# Patient Record
Sex: Male | Born: 1960 | Race: Black or African American | Hispanic: No | Smoking: Current every day smoker
Health system: Southern US, Community
[De-identification: ages and names within clinical notes are randomized; demographics above are authoritative.]

## PROBLEM LIST (undated history)

## (undated) DIAGNOSIS — I10 Essential (primary) hypertension: Secondary | ICD-10-CM

## (undated) DIAGNOSIS — F32A Depression, unspecified: Secondary | ICD-10-CM

## (undated) DIAGNOSIS — J45909 Unspecified asthma, uncomplicated: Secondary | ICD-10-CM

## (undated) DIAGNOSIS — I219 Acute myocardial infarction, unspecified: Secondary | ICD-10-CM

## (undated) DIAGNOSIS — R569 Unspecified convulsions: Secondary | ICD-10-CM

## (undated) HISTORY — DX: Depression, unspecified: F32.A

## (undated) HISTORY — DX: Unspecified convulsions: R56.9

## (undated) HISTORY — DX: Unspecified asthma, uncomplicated: J45.909

## (undated) HISTORY — DX: Essential (primary) hypertension: I10

## (undated) HISTORY — DX: Acute myocardial infarction, unspecified: I21.9

---

## 2021-03-27 ENCOUNTER — Ambulatory Visit: Payer: Self-pay | Admitting: Nurse Practitioner

## 2021-03-29 ENCOUNTER — Other Ambulatory Visit: Payer: Self-pay

## 2021-03-29 ENCOUNTER — Encounter: Payer: Self-pay | Admitting: Nurse Practitioner

## 2021-03-29 ENCOUNTER — Ambulatory Visit (INDEPENDENT_AMBULATORY_CARE_PROVIDER_SITE_OTHER): Payer: Medicare PPO | Admitting: Nurse Practitioner

## 2021-03-29 VITALS — BP 155/99 | HR 82 | Temp 97.8°F | Ht 70.5 in | Wt 199.0 lb

## 2021-03-29 DIAGNOSIS — Z131 Encounter for screening for diabetes mellitus: Secondary | ICD-10-CM | POA: Diagnosis not present

## 2021-03-29 DIAGNOSIS — Z0000 Encounter for general adult medical examination without abnormal findings: Secondary | ICD-10-CM

## 2021-03-29 DIAGNOSIS — F3289 Other specified depressive episodes: Secondary | ICD-10-CM

## 2021-03-29 DIAGNOSIS — Z5900 Homelessness unspecified: Secondary | ICD-10-CM

## 2021-03-29 DIAGNOSIS — I10 Essential (primary) hypertension: Secondary | ICD-10-CM | POA: Diagnosis not present

## 2021-03-29 DIAGNOSIS — Z23 Encounter for immunization: Secondary | ICD-10-CM | POA: Diagnosis not present

## 2021-03-29 DIAGNOSIS — Z760 Encounter for issue of repeat prescription: Secondary | ICD-10-CM | POA: Diagnosis not present

## 2021-03-29 DIAGNOSIS — R413 Other amnesia: Secondary | ICD-10-CM

## 2021-03-29 LAB — POCT URINALYSIS DIP (CLINITEK)
Bilirubin, UA: NEGATIVE
Blood, UA: NEGATIVE
Glucose, UA: NEGATIVE mg/dL
Ketones, POC UA: NEGATIVE mg/dL
Leukocytes, UA: NEGATIVE
Nitrite, UA: NEGATIVE
POC PROTEIN,UA: NEGATIVE
Spec Grav, UA: 1.02 (ref 1.010–1.025)
Urobilinogen, UA: 0.2 E.U./dL
pH, UA: 6 (ref 5.0–8.0)

## 2021-03-29 LAB — POCT GLYCOSYLATED HEMOGLOBIN (HGB A1C): Hemoglobin A1C: 5.7 % — AB (ref 4.0–5.6)

## 2021-03-29 LAB — GLUCOSE, POCT (MANUAL RESULT ENTRY): POC Glucose: 120 mg/dl — AB (ref 70–99)

## 2021-03-29 MED ORDER — CITALOPRAM HYDROBROMIDE 20 MG PO TABS: 20.0000 mg | ORAL_TABLET | Freq: Every day | ORAL | 2 refills | Status: DC

## 2021-03-29 MED ORDER — CARBAMAZEPINE 200 MG PO TABS: 200.0000 mg | ORAL_TABLET | Freq: Two times a day (BID) | ORAL | 2 refills | Status: DC

## 2021-03-29 MED ORDER — LISINOPRIL 20 MG PO TABS: 20.0000 mg | ORAL_TABLET | Freq: Every day | ORAL | 2 refills | Status: DC

## 2021-03-29 MED ORDER — AMLODIPINE BESYLATE 10 MG PO TABS
10.0000 mg | ORAL_TABLET | Freq: Every day | ORAL | 2 refills | Status: DC
Start: 2021-03-29 — End: 2021-07-28

## 2021-03-29 MED ORDER — METOPROLOL TARTRATE 100 MG PO TABS: 100.0000 mg | ORAL_TABLET | Freq: Two times a day (BID) | ORAL | 2 refills | Status: DC

## 2021-03-29 NOTE — Progress Notes (Signed)
[place], [place]  [address] Phone:  [phone]   Fax:  [phone] Subjective:   Patient ID: Cole Cannon, male    DOB: 1961/03/16, 60 y.o.   MRN: [id number]  Chief Complaint  Patient presents with   Establish Care    Recently moved to [place], patient stated he is needing to find somewhere to live currently living with step daughter. Wanting a personal care assistant to help with meds at home Needing lab work and medication refills.    Blood In Stools    1 month ago, has a colonoscopy in August. No finding. Still seeing blood when wiping.    HPI Cole Cannon 60 y.o. male with history  has a past medical history of Asthma, Depression, Heart attack ([place]), Hypertension, and Seizures ([place]). Also has verbalized history of a stroke and MI x 2, dates unknown.To the [facility] to establish care. Patient states that he moved from [place], [place] 2 wks ago and is currently residing with stepdaughter. States that he moved after separating from wife. Prior to separation worked as Astronomer.  Requesting personal care assistant for help with medications and assistance with housing. Frequently forgets when/ if he has taken medications. States that he was given some forms to complete, but needs assistance completing them due to history dyslexia and problems with short term memory.  Endorses have intermittent rectal bleeding for several months. Completed colonoscopy 2 mths ago with negative results. Informed by GI that rectal bleeding resulted from strained during bowel movements. Verbalizes noting small amount of bright red blood on toilet tissue after bowel movement. Last BM 2 days ago. He typically has BM daily, but has had a decreased appetite recently.   Endorses drinking 10 alcoholic beverages per week and smoking marijuana. Has used cocaine in the past, last consumption 2 wks ago. States, " After leaving my wife, I felt suicidal, so I did a bunch of  powder." Currently denies any SI/HI.Compliant with all medications, requesting refill, limited pills remaining from previous prescription.   Denies any other complaints during visiting. Denies any fever. Denies any fatigue, chest pain, shortness of breath, HA or dizziness. Denies any blurred vision, numbness or tingling.   Past Medical History:  Diagnosis Date   Asthma    Depression    Heart attack ([place])    Hypertension    Seizures ([place])       Family History  Problem Relation Age of Onset   Cancer Sister    Cancer Brother     Social History   Socioeconomic History   Marital status: Legally Separated    Spouse name: Not on file   Number of children: Not on file   Years of education: Not on file   Highest education level: Not on file  Occupational History   Not on file  Tobacco Use   Smoking status: Every Day    Packs/day: 1.00    Types: Cigarettes   Smokeless tobacco: Never  Substance and Sexual Activity   Alcohol use: Yes    Alcohol/week: 3.0 standard drinks    Types: 3 Shots of liquor per week   Drug use: Yes    Types: Marijuana   Sexual activity: Not Currently  Other Topics Concern   Not on file  Social History Narrative   Not on file   Social Determinants of Health   Financial Resource Strain: Not on file  Food Insecurity: Not on file  Transportation Needs: Not on  file  Physical Activity: Not on file  Stress: Not on file  Social Connections: Not on file  Intimate Partner Violence: Not on file    Outpatient Medications Prior to Visit  Medication Sig Dispense Refill   carbamazepine (TEGRETOL) 200 MG tablet Take 200 mg by mouth 2 (two) times daily. Take 1 tablet by mouth twice daily.     citalopram (CELEXA) 20 MG tablet Take 20 mg by mouth daily. Take 1 tablet by mouth every day for 14 days.     lisinopril (ZESTRIL) 20 MG tablet Take 20 mg by mouth daily.     metoprolol tartrate (LOPRESSOR) 100 MG tablet Take 100 mg by mouth 2 (two) times daily. Take 1  tablet by mouth twice daily with food.     No facility-administered medications prior to visit.    Allergies  Allergen Reactions   Penicillins Anaphylaxis    Review of Systems  Constitutional:  Negative for chills, fever and malaise/fatigue.  HENT: Negative.    Eyes: Negative.   Respiratory:  Negative for cough and shortness of breath.   Cardiovascular:  Negative for chest pain, palpitations and leg swelling.  Gastrointestinal:  Positive for blood in stool. Negative for abdominal pain, constipation, diarrhea, nausea and vomiting.       Rectal bleeding  Genitourinary: Negative.   Musculoskeletal: Negative.   Skin: Negative.   Neurological: Negative.   Endo/Heme/Allergies: Negative.   Psychiatric/Behavioral:  Positive for depression, memory loss and substance abuse. The patient is not nervous/anxious.   All other systems reviewed and are negative.     Objective:    Physical Exam Vitals reviewed.  Constitutional:      General: He is not in acute distress.    Appearance: Normal appearance.  HENT:     Head: Normocephalic.     Right Ear: Tympanic membrane, ear canal and external ear normal.     Left Ear: Tympanic membrane, ear canal and external ear normal.     Nose: Nose normal.     Mouth/Throat:     Mouth: Mucous membranes are dry.     Pharynx: Oropharynx is clear.  Eyes:     Extraocular Movements: Extraocular movements intact.     Conjunctiva/sclera: Conjunctivae normal.     Pupils: Pupils are equal, round, and reactive to light.  Cardiovascular:     Rate and Rhythm: Normal rate and regular rhythm.     Pulses: Normal pulses.     Heart sounds: Normal heart sounds.     Comments: No obvious peripheral edema Pulmonary:     Effort: Pulmonary effort is normal.     Breath sounds: Normal breath sounds.  Abdominal:     General: Abdomen is flat. Bowel sounds are normal. There is no distension.     Palpations: Abdomen is soft. There is no mass.     Tenderness: There is no  abdominal tenderness. There is no right CVA tenderness, left CVA tenderness or guarding.  Musculoskeletal:        General: Normal range of motion.     Cervical back: Normal range of motion and neck supple.  Skin:    General: Skin is warm and dry.     Capillary Refill: Capillary refill takes less than 2 seconds.  Neurological:     General: No focal deficit present.     Mental Status: He is alert and oriented to person, place, and time.  Psychiatric:        Mood and Affect: Mood normal.  Behavior: Behavior normal.        Thought Content: Thought content normal.        Judgment: Judgment normal.    BP (!) 155/99 (BP Location: Right Arm, Patient Position: Sitting)   Pulse 82   Temp 97.8 F (36.6 C)   Ht 5' 10.5" (1.791 m)   Wt 199 lb (90.3 kg)   SpO2 98%   BMI 28.15 kg/m  Wt Readings from Last 3 Encounters:  03/29/21 199 lb (90.3 kg)    Immunization History  Administered Date(s) Administered   Unspecified SARS-COV-2 Vaccination 07/15/2019, 08/19/2019    Diabetic Foot Exam - Simple   No data filed     No results found for: TSH No results found for: WBC, HGB, HCT, MCV, PLT No results found for: NA, K, CHLORIDE, CO2, GLUCOSE, BUN, CREATININE, BILITOT, ALKPHOS, AST, ALT, PROT, ALBUMIN, CALCIUM, ANIONGAP, EGFR, GFR No results found for: CHOL No results found for: HDL No results found for: LDLCALC No results found for: TRIG No results found for: CHOLHDL Lab Results  Component Value Date   HGBA1C 5.7 (A) 03/29/2021       Assessment & Plan:    Problem List Items Addressed This Visit   None Visit Diagnoses     Screening for diabetes mellitus    -  Primary   Relevant Orders   Glucose (CBG) (Completed)   HgB A1c (Completed)   Healthcare maintenance       Relevant Orders   POCT URINALYSIS DIP (CLINITEK) (Completed)   HIV antibody (with reflex)   Flu Vaccine QUAD 71moIM (Fluarix, Fluzone & Alfiuria Quad PF)   Primary hypertension       Relevant Medications    amLODipine (NORVASC) 10 MG tablet, added to regimen   lisinopril (ZESTRIL) 20 MG tablet   metoprolol tartrate (LOPRESSOR) 100 MG tablet   Other Relevant Orders   CBC with Differential/Platelet   Comprehensive metabolic panel   Lipid panel   Medication refill       Relevant Medications   carbamazepine (TEGRETOL) 200 MG tablet   citalopram (CELEXA) 20 MG tablet   Homelessness       Relevant Orders   AMB Referral to [facility]  Other depression       Relevant Medications   citalopram (CELEXA) 20 MG tablet   Other Relevant Orders   Ambulatory referral to Psychology   Poor short term memory       Patient to follow up in 3 mths for reevaluation of social determinants, depression and hypertension, sooner as needed    I have changed Cole Cannon's carbamazepine, citalopram, lisinopril, and metoprolol tartrate. I am also having him start on amLODipine.  Meds ordered this encounter  Medications   amLODipine (NORVASC) 10 MG tablet    Sig: Take 1 tablet (10 mg total) by mouth daily.    Dispense:  30 tablet    Refill:  2   carbamazepine (TEGRETOL) 200 MG tablet    Sig: Take 1 tablet (200 mg total) by mouth 2 (two) times daily. Take 1 tablet by mouth twice daily.    Dispense:  60 tablet    Refill:  2   citalopram (CELEXA) 20 MG tablet    Sig: Take 1 tablet (20 mg total) by mouth daily. Take 1 tablet by mouth every day for 14 days.    Dispense:  30 tablet    Refill:  2   lisinopril (ZESTRIL) 20 MG tablet    Sig: Take  1 tablet (20 mg total) by mouth daily.    Dispense:  30 tablet    Refill:  2   metoprolol tartrate (LOPRESSOR) 100 MG tablet    Sig: Take 1 tablet (100 mg total) by mouth 2 (two) times daily. Take 1 tablet by mouth twice daily with food.    Dispense:  60 tablet    Refill:  2     Teena Dunk, NP

## 2021-03-29 NOTE — Patient Instructions (Signed)
You were seen today in the [facility] to establish care. Labs were collected, results will be available via MyChart or, if abnormal, you will be contacted by clinic staff. You were prescribed medications, please take as directed. Please follow up in 3 mths  for reevaluation of symptoms discussed during visit and B/P.

## 2021-03-30 ENCOUNTER — Telehealth: Payer: Self-pay | Admitting: Clinical

## 2021-03-30 LAB — COMPREHENSIVE METABOLIC PANEL
ALT: 11 IU/L (ref 0–44)
AST: 13 IU/L (ref 0–40)
Albumin/Globulin Ratio: 2.2 (ref 1.2–2.2)
Albumin: 4.6 g/dL (ref 3.8–4.9)
Alkaline Phosphatase: 99 IU/L (ref 44–121)
BUN/Creatinine Ratio: 13 (ref 10–24)
BUN: 15 mg/dL (ref 8–27)
Bilirubin Total: 0.4 mg/dL (ref 0.0–1.2)
CO2: 23 mmol/L (ref 20–29)
Calcium: 9.7 mg/dL (ref 8.6–10.2)
Chloride: 103 mmol/L (ref 96–106)
Creatinine, Ser: 1.17 mg/dL (ref 0.76–1.27)
Globulin, Total: 2.1 g/dL (ref 1.5–4.5)
Glucose: 110 mg/dL — ABNORMAL HIGH (ref 70–99)
Potassium: 4.4 mmol/L (ref 3.5–5.2)
Sodium: 141 mmol/L (ref 134–144)
Total Protein: 6.7 g/dL (ref 6.0–8.5)
eGFR: 71 mL/min/{1.73_m2} (ref >59)

## 2021-03-30 LAB — CBC WITH DIFFERENTIAL/PLATELET
Basophils Absolute: 0.1 10*3/uL (ref 0.0–0.2)
Basos: 1 %
EOS (ABSOLUTE): 0.1 10*3/uL (ref 0.0–0.4)
Eos: 2 %
Hematocrit: 46.3 % (ref 37.5–51.0)
Hemoglobin: 15.4 g/dL (ref 13.0–17.7)
Immature Grans (Abs): 0 10*3/uL (ref 0.0–0.1)
Immature Granulocytes: 0 %
Lymphocytes Absolute: 2.8 10*3/uL (ref 0.7–3.1)
Lymphs: 41 %
MCH: 30.4 pg (ref 26.6–33.0)
MCHC: 33.3 g/dL (ref 31.5–35.7)
MCV: 92 fL (ref 79–97)
Monocytes Absolute: 0.3 10*3/uL (ref 0.1–0.9)
Monocytes: 5 %
Neutrophils Absolute: 3.5 10*3/uL (ref 1.4–7.0)
Neutrophils: 51 %
Platelets: 280 10*3/uL (ref 150–450)
RBC: 5.06 x10E6/uL (ref 4.14–5.80)
RDW: 11.8 % (ref 11.6–15.4)
WBC: 6.8 10*3/uL (ref 3.4–10.8)

## 2021-03-30 LAB — LIPID PANEL
Chol/HDL Ratio: 3.9 ratio (ref 0.0–5.0)
Cholesterol, Total: 196 mg/dL (ref 100–199)
HDL: 50 mg/dL (ref >39)
LDL Chol Calc (NIH): 126 mg/dL — ABNORMAL HIGH (ref 0–99)
Triglycerides: 112 mg/dL (ref 0–149)
VLDL Cholesterol Cal: 20 mg/dL (ref 5–40)

## 2021-03-30 LAB — HIV ANTIBODY (ROUTINE TESTING W REFLEX): HIV Screen 4th Generation wRfx: NONREACTIVE

## 2021-03-30 NOTE — Telephone Encounter (Signed)
Integrated Behavioral Health General Follow Up Note  03/30/2021 Name: Cole Cannon MRN: [id number] DOB: [birth year] Cole Cannon is a 60 y.o. year old male who sees [name], [name], NP for primary care. LCSW was initially consulted to assist with community resources.  Interpreter: No.   Interpreter Name & Language: none  Assessment: Patient experiencing housing instability and low income. Patient also in need of support at home with medications and personal care.  Ongoing Intervention: Today CSW called patient to follow up on referral from PCP after last visit. Patient and daughter were both on the call and reported patient needs help filling out housing applications for senior housing. He has the applications in hand already. CSW and patient made appointment for tomorrow to meet at the [facility]) for this.   Review of patient status, including review of consultants reports, relevant laboratory and other test results, and collaboration with appropriate care team members and the patient's provider was performed as part of comprehensive patient evaluation and provision of services.    Abigail Butts, LCSW [facility] [phone]

## 2021-03-31 ENCOUNTER — Ambulatory Visit: Payer: Medicare PPO | Admitting: Clinical

## 2021-03-31 ENCOUNTER — Other Ambulatory Visit: Payer: Self-pay

## 2021-03-31 DIAGNOSIS — Z5900 Homelessness unspecified: Secondary | ICD-10-CM

## 2021-03-31 NOTE — Progress Notes (Signed)
Integrated Behavioral Health Case Management Referral Note  03/31/2021 Name: Cole Cannon MRN: [id number] DOB: [birth year] Cole Cannon is a 60 y.o. year old male who sees [name], Jake Church I, NP for primary care. LCSW was consulted to assess patient's needs and assist the patient with  housing resources .  Interpreter: No.   Interpreter Name & Language: none  Assessment: Patient experiencing Housing barriers. He is currently staying with his step-daughter. He has trouble remembering his medications and has also asked his PCP for assistance with a home aide.  Intervention: CSW and patient met at the [facility]) today. Patient had several senior housing applications. Patient has trouble with reading and writing and wasn't able to complete the applications on his own. Worked on applications today with patient. Patient has just moved up to [place] from the [place] area and is in need of community resources. Provided patient information on additional senior housing, [facility], information on how to order a new copy of his birth certificate (as he will need it for housing applications) and [facility], as he was receiving services from their location near [place]. Will plan to follow up with patient by phone next week.   Review of patient status, including review of consultants reports, relevant laboratory and other test results, and collaboration with appropriate care team members and the patient's provider was performed as part of comprehensive patient evaluation and provision of services.    Cole Cannon, [facility] [phone]

## 2021-04-10 ENCOUNTER — Telehealth: Payer: Self-pay | Admitting: Clinical

## 2021-04-10 NOTE — Telephone Encounter (Signed)
Integrated Behavioral Health Case Management Referral Note  04/10/2021 Name: Cole Cannon MRN: [id number] DOB: 1960-12-01 Cole Cannon is a 60 y.o. year old male who sees [name], [name], NP for primary care. LCSW was consulted to assess patient's needs and assist the patient with  housing resources .  Interpreter: No.   Interpreter Name & Language: none  Assessment: Patient experiencing Housing barriers. He is currently staying with his step-daughter. He has trouble remembering his medications and has also asked his PCP for assistance with a home aide.  Intervention: CSW called patient to follow up on visit 03/31/21. Patient had wanted mental health follow up and was previously seen at [facility] in the [employer] area. [facility] will not accept his current insurance. Advised patient of [facility] that does accept his insurance and will coordinate with patient's provider for a referral there.   Review of patient status, including review of consultants reports, relevant laboratory and other test results, and collaboration with appropriate care team members and the patient's provider was performed as part of comprehensive patient evaluation and provision of services.    Abigail Butts, LCSW [facility] [phone]

## 2021-04-11 ENCOUNTER — Other Ambulatory Visit: Payer: Self-pay | Admitting: Nurse Practitioner

## 2021-04-11 DIAGNOSIS — F3289 Other specified depressive episodes: Secondary | ICD-10-CM

## 2021-04-18 ENCOUNTER — Other Ambulatory Visit: Payer: Self-pay | Admitting: Nurse Practitioner

## 2021-04-18 DIAGNOSIS — F3289 Other specified depressive episodes: Secondary | ICD-10-CM

## 2021-04-25 ENCOUNTER — Telehealth: Payer: Self-pay | Admitting: Clinical

## 2021-04-25 NOTE — Telephone Encounter (Signed)
Integrated Behavioral Health Case Management Referral Note  04/25/2021 Name: Aragorn Recker MRN: [id number] DOB: 02/23/1961 Kyandre Okray is a 61 y.o. year old male who sees [name], [name], NP for primary care. LCSW was consulted to assess patient's needs and assist the patient with  housing resources .  Interpreter: No.   Interpreter Name & Language: none  Assessment: Patient experiencing Housing barriers. He is currently staying with his step-daughter. He is also in need of mental health resources.   Intervention: CSW called patient to follow up on referral to [facility]. Patient has not yet received follow up on this. CSW LVM with [facility] requesting follow up.  Review of patient status, including review of consultants reports, relevant laboratory and other test results, and collaboration with appropriate care team members and the patient's provider was performed as part of comprehensive patient evaluation and provision of services.    Abigail Butts, LCSW [facility] [phone]

## 2021-05-26 ENCOUNTER — Telehealth: Payer: Self-pay | Admitting: Clinical

## 2021-05-26 NOTE — Telephone Encounter (Signed)
Integrated Behavioral Health Case Management Referral Note  05/26/2021 Name: Cole Cannon MRN: [id number] DOB: 1961/03/08 [name] is a 60 y.o. year old male who sees [name], [name], NP for primary care. LCSW was consulted to assess patient's needs and assist the patient with  housing resources .  **This was an unsuccessful encounter.**  Interpreter: No.   Interpreter Name & Language: none  Assessment: Patient experiencing Housing barriers. He is currently staying with his step-daughter. He is also in need of mental health resources.   Intervention: CSW coordinated with PCP for new referral to [facility] as first one was not received. CSW scheduled patient's first appointment for therapist at [facility]. Called patient x2 on 12/6 and received a busy signnal. LVM for patient's step daughter on 12/7 requesting call back, no return call. Continued to get a busy signal on patient's phone on 12/9 and 12/15 and 12/16. LVM for patient's step daughter again on 12/16, awaiting return call. [facility]'s appointment with [facility] is scheduled for 06/13/20. CSW to follow and attempt to advise patient of this appointment.  Review of patient status, including review of consultants reports, relevant laboratory and other test results, and collaboration with appropriate care team members and the patient's provider was performed as part of comprehensive patient evaluation and provision of services.    Abigail Butts, LCSW [facility] [phone]

## 2021-05-30 ENCOUNTER — Other Ambulatory Visit: Payer: Self-pay | Admitting: Nurse Practitioner

## 2021-05-30 DIAGNOSIS — I10 Essential (primary) hypertension: Secondary | ICD-10-CM

## 2021-06-02 DIAGNOSIS — M79604 Pain in right leg: Secondary | ICD-10-CM | POA: Diagnosis not present

## 2021-06-02 DIAGNOSIS — Z79899 Other long term (current) drug therapy: Secondary | ICD-10-CM | POA: Diagnosis not present

## 2021-06-02 DIAGNOSIS — F250 Schizoaffective disorder, bipolar type: Secondary | ICD-10-CM

## 2021-06-02 DIAGNOSIS — R45851 Suicidal ideations: Secondary | ICD-10-CM | POA: Insufficient documentation

## 2021-06-02 DIAGNOSIS — F209 Schizophrenia, unspecified: Secondary | ICD-10-CM | POA: Diagnosis present

## 2021-06-02 DIAGNOSIS — I255 Ischemic cardiomyopathy: Secondary | ICD-10-CM | POA: Insufficient documentation

## 2021-06-02 DIAGNOSIS — R21 Rash and other nonspecific skin eruption: Secondary | ICD-10-CM | POA: Insufficient documentation

## 2021-06-02 DIAGNOSIS — I2510 Atherosclerotic heart disease of native coronary artery without angina pectoris: Secondary | ICD-10-CM | POA: Diagnosis not present

## 2021-06-02 DIAGNOSIS — G40909 Epilepsy, unspecified, not intractable, without status epilepticus: Secondary | ICD-10-CM | POA: Insufficient documentation

## 2021-06-02 DIAGNOSIS — F22 Delusional disorders: Secondary | ICD-10-CM | POA: Insufficient documentation

## 2021-06-02 DIAGNOSIS — F329 Major depressive disorder, single episode, unspecified: Secondary | ICD-10-CM | POA: Diagnosis present

## 2021-06-02 DIAGNOSIS — I10 Essential (primary) hypertension: Secondary | ICD-10-CM | POA: Diagnosis not present

## 2021-06-02 DIAGNOSIS — F17210 Nicotine dependence, cigarettes, uncomplicated: Secondary | ICD-10-CM | POA: Diagnosis not present

## 2021-06-02 DIAGNOSIS — Z7982 Long term (current) use of aspirin: Secondary | ICD-10-CM | POA: Diagnosis not present

## 2021-06-02 DIAGNOSIS — Z20822 Contact with and (suspected) exposure to covid-19: Secondary | ICD-10-CM | POA: Diagnosis not present

## 2021-06-02 DIAGNOSIS — Z720 Tobacco use: Secondary | ICD-10-CM | POA: Diagnosis present

## 2021-06-02 DIAGNOSIS — M79605 Pain in left leg: Secondary | ICD-10-CM | POA: Diagnosis not present

## 2021-06-02 DIAGNOSIS — Z880 Allergy status to penicillin: Secondary | ICD-10-CM | POA: Diagnosis not present

## 2021-06-02 DIAGNOSIS — F1910 Other psychoactive substance abuse, uncomplicated: Secondary | ICD-10-CM | POA: Diagnosis present

## 2021-06-02 DIAGNOSIS — Z5900 Homelessness unspecified: Secondary | ICD-10-CM | POA: Diagnosis not present

## 2021-06-02 DIAGNOSIS — F319 Bipolar disorder, unspecified: Secondary | ICD-10-CM | POA: Diagnosis present

## 2021-06-03 DIAGNOSIS — I255 Ischemic cardiomyopathy: Secondary | ICD-10-CM | POA: Diagnosis not present

## 2021-06-03 DIAGNOSIS — I2510 Atherosclerotic heart disease of native coronary artery without angina pectoris: Secondary | ICD-10-CM | POA: Diagnosis not present

## 2021-06-03 DIAGNOSIS — R21 Rash and other nonspecific skin eruption: Secondary | ICD-10-CM | POA: Diagnosis not present

## 2021-06-03 DIAGNOSIS — I10 Essential (primary) hypertension: Secondary | ICD-10-CM | POA: Diagnosis not present

## 2021-06-04 DIAGNOSIS — M7701 Medial epicondylitis, right elbow: Secondary | ICD-10-CM | POA: Diagnosis not present

## 2021-06-04 DIAGNOSIS — K5900 Constipation, unspecified: Secondary | ICD-10-CM | POA: Diagnosis not present

## 2021-06-06 DIAGNOSIS — Z79899 Other long term (current) drug therapy: Secondary | ICD-10-CM | POA: Diagnosis not present

## 2021-06-08 ENCOUNTER — Telehealth: Payer: Self-pay | Admitting: Clinical

## 2021-06-08 NOTE — Telephone Encounter (Signed)
Integrated Behavioral Health Case Management Referral Note  06/08/2021 Name: Chadric Kimberley MRN: [id number] DOB: [birth year] Cormac Wint is a 60 y.o. year old male who sees [name], [name], NP for primary care. LCSW was consulted to assess patient's needs and assist the patient with  housing resources .  **This was an unsuccessful encounter.**  Interpreter: No.   Interpreter Name & Language: none  Assessment: Patient experiencing Housing barriers. He is currently staying with his step-daughter. He is also in need of mental health resources.   Intervention: CSW previously scheduled therapy appointment for patient at [facility] for 06/13/21. Have not been able to reach patient by phone to advise him of this appointment, see note 05/26/21. Called patient again today at [phone] and again got busy signal. Called patient's stepdaughter at [phone] and no answer.   Review of patient status, including review of consultants reports, relevant laboratory and other test results, and collaboration with appropriate care team members and the patient's provider was performed as part of comprehensive patient evaluation and provision of services.    Abigail Butts, LCSW [facility] [phone]

## 2021-06-11 DIAGNOSIS — E559 Vitamin D deficiency, unspecified: Secondary | ICD-10-CM | POA: Diagnosis not present

## 2021-06-30 ENCOUNTER — Ambulatory Visit: Payer: Medicare PPO | Admitting: Nurse Practitioner

## 2021-07-03 ENCOUNTER — Other Ambulatory Visit: Payer: Self-pay | Admitting: Nurse Practitioner

## 2021-07-03 DIAGNOSIS — I10 Essential (primary) hypertension: Secondary | ICD-10-CM

## 2021-07-03 DIAGNOSIS — Z760 Encounter for issue of repeat prescription: Secondary | ICD-10-CM

## 2021-07-12 ENCOUNTER — Other Ambulatory Visit: Payer: Self-pay | Admitting: Nurse Practitioner

## 2021-07-12 DIAGNOSIS — I10 Essential (primary) hypertension: Secondary | ICD-10-CM

## 2021-07-12 DIAGNOSIS — Z760 Encounter for issue of repeat prescription: Secondary | ICD-10-CM

## 2021-07-19 ENCOUNTER — Encounter: Payer: Medicaid Other | Attending: Psychology | Admitting: Psychology

## 2021-07-21 ENCOUNTER — Ambulatory Visit (HOSPITAL_COMMUNITY)
Admission: EM | Admit: 2021-07-21 | Discharge: 2021-07-23 | Disposition: A | Discharge status: Other Acute Inpatient Hospital | Payer: Medicare Other | Attending: Family | Admitting: Family

## 2021-07-21 DIAGNOSIS — Z20822 Contact with and (suspected) exposure to covid-19: Secondary | ICD-10-CM | POA: Insufficient documentation

## 2021-07-21 DIAGNOSIS — R45851 Suicidal ideations: Secondary | ICD-10-CM | POA: Insufficient documentation

## 2021-07-21 DIAGNOSIS — F1910 Other psychoactive substance abuse, uncomplicated: Secondary | ICD-10-CM | POA: Insufficient documentation

## 2021-07-21 DIAGNOSIS — F331 Major depressive disorder, recurrent, moderate: Secondary | ICD-10-CM

## 2021-07-21 DIAGNOSIS — F1290 Cannabis use, unspecified, uncomplicated: Secondary | ICD-10-CM | POA: Insufficient documentation

## 2021-07-21 DIAGNOSIS — F319 Bipolar disorder, unspecified: Secondary | ICD-10-CM | POA: Diagnosis not present

## 2021-07-21 DIAGNOSIS — I219 Acute myocardial infarction, unspecified: Secondary | ICD-10-CM

## 2021-07-21 DIAGNOSIS — F1410 Cocaine abuse, uncomplicated: Secondary | ICD-10-CM | POA: Insufficient documentation

## 2021-07-21 LAB — POCT URINE DRUG SCREEN - MANUAL ENTRY (I-SCREEN)
POC Amphetamine UR: POSITIVE — AB
POC Buprenorphine (BUP): NOT DETECTED
POC Cocaine UR: POSITIVE — AB
POC Marijuana UR: POSITIVE — AB
POC Methadone UR: NOT DETECTED
POC Methamphetamine UR: POSITIVE — AB
POC Morphine: NOT DETECTED
POC Oxazepam (BZO): NOT DETECTED
POC Oxycodone UR: NOT DETECTED
POC Secobarbital (BAR): NOT DETECTED

## 2021-07-21 LAB — COMPREHENSIVE METABOLIC PANEL
ALT: 15 U/L (ref 0–44)
AST: 21 U/L (ref 15–41)
Albumin: 3.4 g/dL — ABNORMAL LOW (ref 3.5–5.0)
Alkaline Phosphatase: 109 U/L (ref 38–126)
Anion gap: 12 (ref 5–15)
BUN: 12 mg/dL (ref 6–20)
CO2: 22 mmol/L (ref 22–32)
Calcium: 9.5 mg/dL (ref 8.9–10.3)
Chloride: 103 mmol/L (ref 98–111)
Creatinine, Ser: 1.36 mg/dL — ABNORMAL HIGH (ref 0.61–1.24)
GFR, Estimated: 60 mL/min — ABNORMAL LOW (ref >60)
Glucose, Bld: 102 mg/dL — ABNORMAL HIGH (ref 70–99)
Potassium: 3.8 mmol/L (ref 3.5–5.1)
Sodium: 137 mmol/L (ref 135–145)
Total Bilirubin: 0.7 mg/dL (ref 0.3–1.2)
Total Protein: 6.9 g/dL (ref 6.5–8.1)

## 2021-07-21 LAB — RESP PANEL BY RT-PCR (FLU A&B, COVID) ARPGX2
Influenza A by PCR: NEGATIVE
Influenza B by PCR: NEGATIVE
SARS Coronavirus 2 by RT PCR: NEGATIVE

## 2021-07-21 LAB — POC SARS CORONAVIRUS 2 AG -  ED: SARS Coronavirus 2 Ag: NEGATIVE

## 2021-07-21 MED ORDER — ACETAMINOPHEN 325 MG PO TABS: 650.0000 mg | ORAL_TABLET | Freq: Four times a day (QID) | ORAL | Status: DC | PRN

## 2021-07-21 MED ORDER — LISINOPRIL 5 MG PO TABS
5.0000 mg | ORAL_TABLET | Freq: Every day | ORAL | Status: DC
  Administered 2021-07-21 – 2021-07-23 (×3): 5 mg via ORAL
  Filled 2021-07-21 (×3): qty 1

## 2021-07-21 MED ORDER — ADULT MULTIVITAMIN W/MINERALS CH
1.0000 | ORAL_TABLET | Freq: Every day | ORAL | Status: DC
  Administered 2021-07-21 – 2021-07-23 (×3): 1 via ORAL
  Filled 2021-07-21 (×3): qty 1

## 2021-07-21 MED ORDER — HYDROXYZINE HCL 25 MG PO TABS
25.0000 mg | ORAL_TABLET | Freq: Three times a day (TID) | ORAL | Status: DC | PRN
  Administered 2021-07-21 – 2021-07-22 (×2): 25 mg via ORAL
  Filled 2021-07-21 (×2): qty 1

## 2021-07-21 MED ORDER — ALUM & MAG HYDROXIDE-SIMETH 200-200-20 MG/5ML PO SUSP: 30.0000 mL | ORAL | Status: DC | PRN

## 2021-07-21 MED ORDER — THIAMINE HCL 100 MG PO TABS
100.0000 mg | ORAL_TABLET | Freq: Every day | ORAL | Status: DC
  Administered 2021-07-22 – 2021-07-23 (×2): 100 mg via ORAL
  Filled 2021-07-21 (×2): qty 1

## 2021-07-21 MED ORDER — TRAZODONE HCL 50 MG PO TABS
50.0000 mg | ORAL_TABLET | Freq: Every evening | ORAL | Status: DC | PRN
  Filled 2021-07-21: qty 1

## 2021-07-21 MED ORDER — LORAZEPAM 1 MG PO TABS
1.0000 mg | ORAL_TABLET | Freq: Four times a day (QID) | ORAL | Status: DC | PRN
  Administered 2021-07-21: 1 mg via ORAL
  Filled 2021-07-21: qty 1

## 2021-07-21 MED ORDER — LOPERAMIDE HCL 2 MG PO CAPS: 2.0000 mg | ORAL_CAPSULE | ORAL | Status: DC | PRN

## 2021-07-21 MED ORDER — CITALOPRAM HYDROBROMIDE 10 MG PO TABS
10.0000 mg | ORAL_TABLET | Freq: Every day | ORAL | Status: DC
  Administered 2021-07-21 – 2021-07-23 (×3): 10 mg via ORAL
  Filled 2021-07-21 (×3): qty 1

## 2021-07-21 MED ORDER — THIAMINE HCL 100 MG/ML IJ SOLN
100.0000 mg | Freq: Once | INTRAMUSCULAR | Status: AC
  Administered 2021-07-21: 100 mg via INTRAMUSCULAR
  Filled 2021-07-21: qty 2

## 2021-07-21 MED ORDER — ONDANSETRON 4 MG PO TBDP: 4.0000 mg | ORAL_TABLET | Freq: Four times a day (QID) | ORAL | Status: DC | PRN

## 2021-07-21 MED ORDER — CARBAMAZEPINE ER 100 MG PO TB12
100.0000 mg | ORAL_TABLET | Freq: Two times a day (BID) | ORAL | Status: DC
  Administered 2021-07-21 – 2021-07-23 (×4): 100 mg via ORAL
  Filled 2021-07-21 (×4): qty 1

## 2021-07-21 MED ORDER — AMLODIPINE BESYLATE 5 MG PO TABS
5.0000 mg | ORAL_TABLET | Freq: Every day | ORAL | Status: DC
  Administered 2021-07-21 – 2021-07-23 (×3): 5 mg via ORAL
  Filled 2021-07-21 (×3): qty 1

## 2021-07-21 MED ORDER — MAGNESIUM HYDROXIDE 400 MG/5ML PO SUSP: 30.0000 mL | Freq: Every day | ORAL | Status: DC | PRN

## 2021-07-21 NOTE — BH Assessment (Signed)
Comprehensive Clinical Assessment (CCA) Note  07/21/2021 Macarthur Niziolek [id number]  DISPOSITION: Per Ricky Ala NP, pt is recommended for overnight continuous observation at [facility]. Re-assessment by psychiatry tomorrow.   The patient demonstrates the following risk factors for suicide: Chronic risk factors for suicide include: psychiatric disorder of Bipolar d/o and substance use disorder. Acute risk factors for suicide include: family or marital conflict. Protective factors for this patient include: positive social support and hope for the future. Considering these factors, the overall suicide risk at this point appears to be moderate. Patient is appropriate for outpatient follow up.  [facility] ED from 07/21/2021 in [facility] Office Visit from 03/29/2021 in [place]  C-SSRS RISK CATEGORY High Risk Error: Question 6 not populated      Pt is a 61 yo male who presents to [facility] voluntarily and unaccompanied reporting SI and a plan to cut his wrist. Pt states that " I just need help". Pt was recently separated from his wife of 8 years, in September, 2022. Pt states that he has been diagnosed with bipolar d/o and schizoaffective disorder. Pt states that he took 1 gram of cocaine and drank 1 beer prior to coming to this facility. Pt reported regular use of cocaine, alcohol and cannabis. Pt states that he has been inconsistent with his prescribed medication but he did take it yesterday. Pt denies any hx of suicide attempts. Pt denies HI and AVH. Pt indicated his main stressors included marital issues (separation), his substance use/abuse and financial issues as a result.   Pt was alert, oriented x 4, calm and cooperative. Pt was anxious and depressed and his affect was congruent. Hi speech and movement was within normal limits. Good eye contact. Thought content was coherent and relevant. No indications of responding to internal stimuli or delusional  thinking.   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 Self  What Is the Reason for Your Visit/Call Today? Pt is a 61 yo male who presents to [facility] voluntarily and unaccompanied reporting SI and a plan to cut his wrist. Pt states that " I just need help". Pt was recently separated from his wife of 8 years, in September, 2022. Pt states that he has been diagnosed with bipolar d/o and schizoaffective disorder. Pt states that he took 1 gram of cocaine and drank 1 beer prior to coming to this facility. Pt reported regular use of cocaine, alcohol and cannabis. Pt states that he has been inconsistent with his prescribed medication but he did take it yesterday. Pt denies any hx of suicide attempts. Pt denies HI and AVH.  How Long Has This Been Causing You Problems? > than 6 months  What Do You Feel Would Help You the Most Today? -- ("not sure")   Have You Recently Had Any Thoughts About Hurting Yourself? Yes  Are You Planning to Commit Suicide/Harm Yourself At This time? No (Denies plan or true intent)   Have you Recently Had Thoughts About [place]? No  Are You Planning to Harm Someone at This Time? No  Explanation: No data recorded  Have You Used Any Alcohol or Drugs in the Past 24 Hours? Yes  How Long Ago  Did You Use Drugs or Alcohol? No data recorded What Did You Use and How Much? alcohol and cocaine today   Do You Currently Have a Therapist/Psychiatrist? No  Name of Therapist/Psychiatrist: No data recorded  Have You Been Recently Discharged From Any Office Practice or Programs? No  Explanation of Discharge From Practice/Program: No data recorded    CCA Screening Triage Referral Assessment Type of Contact:  Face-to-Face  Telemedicine Service Delivery:   Is this Initial or Reassessment? No data recorded Date Telepsych consult ordered in [facility]:  No data recorded Time Telepsych consult ordered in [facility]:  No data recorded Location of Assessment: [facility] Assessment Services  Provider Location: [facility] Assessment Services   Collateral Involvement: none   Does Patient Have a Automotive engineer Guardian? No data recorded Name and Contact of Legal Guardian: No data recorded If Minor and Not Living with Parent(s), Who has Custody? No data recorded Is CPS involved or ever been involved? -- (uta)  Is APS involved or ever been involved? -- Rich Reining)   Patient Determined To Be At Risk for Harm To Self or Others Based on Review of Patient Reported Information or Presenting Complaint? Yes, for Self-Harm  Method: No data recorded Availability of Means: No data recorded Intent: No data recorded Notification Required: No data recorded Additional Information for Danger to Others Potential: No data recorded Additional Comments for Danger to Others Potential: No data recorded Are There Guns or Other Weapons in Your Home? No data recorded Types of Guns/Weapons: No data recorded Are These Weapons Safely Secured?                            No data recorded Who Could Verify You Are Able To Have These Secured: No data recorded Do You Have any Outstanding Charges, Pending Court Dates, Parole/Probation? No data recorded Contacted To Inform of Risk of Harm To Self or Others: No data recorded   Does Patient Present under Involuntary Commitment? No  IVC Papers Initial File Date: No data recorded  [place] of Residence: [place]   Patient Currently Receiving the Following Services: No data recorded  Determination of Need: Urgent (48 hours) (Per Hillery Jacks NP, pt is recommended for overnight continuoua observation at [facility].)   Options For Referral: Inpatient Hospitalization     CCA Biopsychosocial Patient  Reported Schizophrenia/Schizoaffective Diagnosis in Past: Yes   Strengths: uta   Mental Health Symptoms Depression:   Change in energy/activity; Hopelessness   Duration of Depressive symptoms:  Duration of Depressive Symptoms: Greater than two weeks   Mania:   None   Anxiety:    Worrying   Psychosis:   None   Duration of Psychotic symptoms:    Trauma:   None   Obsessions:   None   Compulsions:   None   Inattention:   None   Hyperactivity/Impulsivity:   None   Oppositional/Defiant Behaviors:   None   Emotional Irregularity:   None   Other Mood/Personality Symptoms:   uta    Mental Status Exam Appearance and self-care  Stature:   Average   Weight:   Average weight   Clothing:   Casual; Neat/clean   Grooming:   Normal   Cosmetic use:   None   Posture/gait:   Normal   Motor activity:   Not Remarkable   Sensorium  Attention:   Normal   Concentration:   Normal   Orientation:   Time; Situation; Place; Person  Recall/memory:   Normal   Affect and Mood  Affect:   Anxious; Depressed   Mood:   Anxious; Depressed   Relating  Eye contact:   Normal   Facial expression:   Depressed   Attitude toward examiner:   Cooperative; Guarded   Thought and Language  Speech flow:  Clear and Coherent   Thought content:   Appropriate to Mood and Circumstances   Preoccupation:   None   Hallucinations:   None   Organization:  No data recorded  [employer] of Knowledge:   Average   Intelligence:   Average   Abstraction:   Functional   Judgement:   Impaired   Reality Testing:   Adequate   Insight:   Lacking   Decision Making:   Impulsive   Social Functioning  Social Maturity:   Impulsive   Social Judgement:   "Street Smart"   Stress  Stressors:   Family conflict; Grief/losses; Relationship   Coping Ability:   Deficient supports   Skill Deficits:   None   Supports:   Support  needed     Religion:    Leisure/Recreation:    Exercise/Diet:     CCA Employment/Education Employment/Work Situation:    Education: Education Is Patient Currently Attending School?: No Did You Have Any Difficulty At [employer]?: No   CCA Family/Childhood History Family and Relationship History: Family history Marital status: Separated Separated, when?: September 2022 What types of issues is patient dealing with in the relationship?: Substance use Does patient have children?: Yes  Childhood History:  Childhood History By whom was/is the patient raised?: Both parents Did patient suffer any verbal/emotional/physical/sexual abuse as a child?: No Has patient ever been sexually abused/assaulted/raped as an adolescent or adult?: No  Child/Adolescent Assessment:     CCA Substance Use Alcohol/Drug Use: Alcohol / Drug Use Pain Medications: see MAR Prescriptions: see MAR Over the Counter: see MAR History of alcohol / drug use?: Yes Longest period of sobriety (when/how long): unknown Negative Consequences of Use: Personal relationships, Financial Substance #1 Name of Substance 1: cocaine 1 - Age of First Use: unknown 1 - Amount (size/oz): varies 1 - Frequency: daily 1 - Duration: ongoing 1 - Last Use / Amount: today 07/21/21 1 - Method of Aquiring: unknown 1- Route of Use: snort Substance #2 Name of Substance 2: alcohol 2 - Age of First Use: unknown 2 - Amount (size/oz): varies 2 - Frequency: daily 2 - Duration: ongoing 2 - Last Use / Amount: today 07/21/21 2 - Method of Aquiring: purchase 2 - Route of Substance Use: drink/oral Substance #3 Name of Substance 3: cannabis 3 - Age of First Use: unknown 3 - Amount (size/oz): varies 3 - Frequency: daily 3 - Duration: ongoing 3 - Last Use / Amount: today 07/21/21 3 - Method of Aquiring: friend 3 - Route of Substance Use: smoke                   ASAM's:  Six Dimensions of Multidimensional  Assessment  Dimension 1:  Acute Intoxication and/or Withdrawal Potential:      Dimension 2:  Biomedical Conditions and Complications:      Dimension 3:  Emotional, Behavioral, or Cognitive Conditions and Complications:     Dimension 4:  Readiness to Change:     Dimension 5:  Relapse, Continued use, or Continued Problem Potential:     Dimension 6:  Recovery/Living Environment:     ASAM Severity Score:    ASAM Recommended Level  of Treatment:     Substance use Disorder (SUD)    Recommendations for Services/Supports/Treatments:    Discharge Disposition:    DSM5 Diagnoses: There are no problems to display for this patient.    Referrals to Alternative Service(s): Referred to Alternative Service(s):   Place:   Date:   Time:    Referred to Alternative Service(s):   Place:   Date:   Time:    Referred to Alternative Service(s):   Place:   Date:   Time:    Referred to Alternative Service(s):   Place:   Date:   Time:     Fuller Mandril, Counselor  Stanton Kidney T. Mare Ferrari, [place], [facility], [facility] Triage Specialist [facility]

## 2021-07-21 NOTE — ED Notes (Signed)
Pt woke up for CIWA screening (scored 2). Pt was sleeping in adult continuous assessment, respirations even and unlabored, with no signs of acute distress. Will continue to monitor for safety.

## 2021-07-21 NOTE — ED Notes (Signed)
Pt in continuous assessment lying in bed during assessment. Pt A&Ox4, calm, and cooperative with no complaints of pain or signs of acute distress. Pt denies SI, HI or AVH at present. Said earlier that he was "thinking about cutting veins in wrist, but not now." States his "mood is better and feels like he is getting the help he needs." He "feels safer and not wanting to self harm." Pt verbally agreed not to self harm. Will continue to monitor for safety.

## 2021-07-21 NOTE — ED Provider Notes (Signed)
Behavioral Health Admission H&P [facility] & OBS)  Date: 07/21/21 Patient Name: Cole Cannon MRN: [id number] Chief Complaint:  Chief Complaint  Patient presents with   Depression   Medication Problem      Diagnoses:  Final diagnoses:  Moderate episode of recurrent major depressive disorder (HCC)  Cocaine abuse ([place])    HPI: Cole Cannon is a 61 year old male that presents to [facility] urgent care as a walk-in seeking substance abuse resources.  States " I just want to end it all."  He denied plan or intent.  Reports a history of suicidal ideations. Cole Cannon reports using "powder" (cocaine,  marijuana and alcohol daily.  Reports multiple stressors related to financial, substance use and marital.  Patient is very difficult to follow throughout this assessment.    Patient provided verbal authorization to follow-up with his daughter regarding current medications he is prescribed.  NP attempted to contact patient's daughter Moshe Salisbury at [phone].  No answer will recommend overnight observation restart home medications where appropriate.  Patient to be reevaluated by psychiatry.   Cole Cannon, 61 y.o., male patient seen face to face by this provider and chart reviewed on 07/21/21.  On evaluation Cole Cannon reports " I need a long-term treatment facility." Reported he was staying is [facility].  He reports a history of major depressive disorder, bipolar disorder schizophrenia and paranoia.   During evaluation Cole Cannon is sitting no acute distress. He is alert/oriented x 4; calm/cooperative; and mood congruent with affect.  He is speaking in a clear tone at moderate volume, and normal pace; with good eye contact. His  thought process is coherent and relevant; There is no indication that he is currently responding to internal/external stimuli or experiencing delusional thought content. Patient has remained calm throughout assessment and has answered questions  appropriately.       PHQ 2-9:  [place] Visit from 03/29/2021 in [place]  Thoughts that you would be better off dead, or of hurting yourself in some way Several days  PHQ-9 Total Score 18       [place] Visit from 03/29/2021 in [place] Error: Question 6 not populated        Total Time spent with patient: 15 minutes  Musculoskeletal  Strength & Muscle Tone: within normal limits Gait & Station: normal Patient leans: N/A  Psychiatric Specialty Exam  Presentation General Appearance: Appropriate for Environment Eye Contact:Good Speech:Clear and Coherent Speech Volume:Normal Handedness:Right  Mood and Affect  Mood:Anxious; Depressed Affect:Congruent  Thought Process  Thought Processes:Coherent Descriptions of Associations:Intact  Orientation:Full (Time, Place and Person)  Thought Content:Logical    Hallucinations:Hallucinations: None  Ideas of Reference:None  Suicidal Thoughts:Suicidal Thoughts: Yes, Passive SI Passive Intent and/or Plan: Without Intent; Without Plan  Homicidal Thoughts:Homicidal Thoughts: No   Sensorium  Memory:Recent Fair; Remote Good; Immediate Good Judgment:Fair Insight:No data recorded  Executive Functions  Concentration:Fair Attention Span:Good [place]  Psychomotor Activity  Psychomotor Activity:Psychomotor Activity: Normal  Assets  Assets:Communication Skills; Housing  Sleep  Sleep:Sleep: Fair  Nutritional Assessment (For OBS and FBC admissions only) Has the patient had a weight loss or gain of 10 pounds or more in the last 3 months?: No Has the patient had a decrease in food intake/or appetite?: No Does the patient have dental problems?: No Does the patient have eating habits or behaviors that may be indicators of an eating disorder including binging or inducing vomiting?:  No Has the  patient recently lost weight without trying?: 0 Has the patient been eating poorly because of a decreased appetite?: 0 Malnutrition Screening Tool Score: 0    Physical Exam Vitals and nursing note reviewed.  Constitutional:      Appearance: Normal appearance.  Cardiovascular:     Rate and Rhythm: Normal rate and regular rhythm.  Neurological:     Mental Status: He is alert and oriented to person, place, and time.  Psychiatric:        Mood and Affect: Mood normal.        Behavior: Behavior normal.   Review of Systems  Constitutional: Negative.   Skin: Negative.   Psychiatric/Behavioral:  Positive for depression, substance abuse and suicidal ideas. Negative for hallucinations. The patient is nervous/anxious.   All other systems reviewed and are negative.  Blood pressure 124/88, pulse 78, temperature 98 F (36.7 C), temperature source Oral, resp. rate 18, SpO2 100 %. There is no height or weight on file to calculate BMI.  Past Psychiatric History: Celexa and tegretol   Is the patient at risk to self? Yes  Has the patient been a risk to self in the past 6 months? No .    Has the patient been a risk to self within the distant past? No   Is the patient a risk to others? No   Has the patient been a risk to others in the past 6 months? No   Has the patient been a risk to others within the distant past? No   Past Medical History:  Past Medical History:  Diagnosis Date   Asthma    Depression    Heart attack ([place])    Hypertension    Seizures ([place])      Family History:  Family History  Problem Relation Age of Onset   Cancer Sister    Cancer Brother     Social History:  Social History   Socioeconomic History   Marital status: Legally Separated    Spouse name: Not on file   Number of children: Not on file   Years of education: Not on file   Highest education level: Not on file  Occupational History   Not on file  Tobacco Use   Smoking status: Every Day     Packs/day: 1.00    Types: Cigarettes   Smokeless tobacco: Never  Substance and Sexual Activity   Alcohol use: Yes    Alcohol/week: 3.0 standard drinks    Types: 3 Shots of liquor per week   Drug use: Yes    Types: Marijuana   Sexual activity: Not Currently  Other Topics Concern   Not on file  Social History Narrative   Not on file   Social Determinants of Health   Financial Resource Strain: Not on file  Food Insecurity: Not on file  Transportation Needs: Not on file  Physical Activity: Not on file  Stress: Not on file  Social Connections: Not on file  Intimate Partner Violence: Not on file    SDOH:  SDOH Screenings   Alcohol Screen: Not on file  Depression (PHQ2-9): Medium Risk   PHQ-2 Score: 18  Financial Resource Strain: Not on file  Food Insecurity: Not on file  Housing: Not on file  Physical Activity: Not on file  Social Connections: Not on file  Stress: Not on file  Tobacco Use: High Risk   Smoking Tobacco Use: Every Day   Smokeless Tobacco Use: Never   Passive Exposure:  Not on file  Transportation Needs: Not on file    Last Labs:  Office Visit on 03/29/2021  Component Date Value Ref Range Status   POC Glucose 03/29/2021 120 (A)  70 - 99 mg/dl Final   Hemoglobin A1C 03/29/2021 5.7 (A)  4.0 - 5.6 % Final   Glucose, UA 03/29/2021 negative  negative mg/dL Final   Bilirubin, UA 03/29/2021 negative  negative Final   Ketones, POC UA 03/29/2021 negative  negative mg/dL Final   Spec Grav, UA 03/29/2021 1.020  1.010 - 1.025 Final   Blood, UA 03/29/2021 negative  negative Final   pH, UA 03/29/2021 6.0  5.0 - 8.0 Final   POC PROTEIN,UA 03/29/2021 negative  negative, trace Final   Urobilinogen, UA 03/29/2021 0.2  0.2 or 1.0 E.U./dL Final   Nitrite, UA 03/29/2021 Negative  Negative Final   Leukocytes, UA 03/29/2021 Negative  Negative Final   HIV Screen 4th Generation wRfx 03/29/2021 Non Reactive  Non Reactive Final   Comment: HIV Negative HIV-1/HIV-2 antibodies  and HIV-1 p24 antigen were NOT detected. There is no laboratory evidence of HIV infection.    WBC 03/29/2021 6.8  3.4 - 10.8 x10E3/uL Final   RBC 03/29/2021 5.06  4.14 - 5.80 x10E6/uL Final   Hemoglobin 03/29/2021 15.4  13.0 - 17.7 g/dL Final   Hematocrit 03/29/2021 46.3  37.5 - 51.0 % Final   MCV 03/29/2021 92  79 - 97 fL Final   MCH 03/29/2021 30.4  26.6 - 33.0 pg Final   MCHC 03/29/2021 33.3  31.5 - 35.7 g/dL Final   RDW 03/29/2021 11.8  11.6 - 15.4 % Final   Platelets 03/29/2021 280  150 - 450 x10E3/uL Final   Neutrophils 03/29/2021 51  Not Estab. % Final   Lymphs 03/29/2021 41  Not Estab. % Final   Monocytes 03/29/2021 5  Not Estab. % Final   Eos 03/29/2021 2  Not Estab. % Final   Basos 03/29/2021 1  Not Estab. % Final   Neutrophils Absolute 03/29/2021 3.5  1.4 - 7.0 x10E3/uL Final   Lymphocytes Absolute 03/29/2021 2.8  0.7 - 3.1 x10E3/uL Final   Monocytes Absolute 03/29/2021 0.3  0.1 - 0.9 x10E3/uL Final   EOS (ABSOLUTE) 03/29/2021 0.1  0.0 - 0.4 x10E3/uL Final   Basophils Absolute 03/29/2021 0.1  0.0 - 0.2 x10E3/uL Final   Immature Granulocytes 03/29/2021 0  Not Estab. % Final   Immature Grans (Abs) 03/29/2021 0.0  0.0 - 0.1 x10E3/uL Final   Glucose 03/29/2021 110 (H)  70 - 99 mg/dL Final   BUN 03/29/2021 15  8 - 27 mg/dL Final   Creatinine, Ser 03/29/2021 1.17  0.76 - 1.27 mg/dL Final   eGFR 03/29/2021 71  >59 mL/min/1.73 Final   BUN/Creatinine Ratio 03/29/2021 13  10 - 24 Final   Sodium 03/29/2021 141  134 - 144 mmol/L Final   Potassium 03/29/2021 4.4  3.5 - 5.2 mmol/L Final   Chloride 03/29/2021 103  96 - 106 mmol/L Final   CO2 03/29/2021 23  20 - 29 mmol/L Final   Calcium 03/29/2021 9.7  8.6 - 10.2 mg/dL Final   Total Protein 03/29/2021 6.7  6.0 - 8.5 g/dL Final   Albumin 03/29/2021 4.6  3.8 - 4.9 g/dL Final   Globulin, Total 03/29/2021 2.1  1.5 - 4.5 g/dL Final   Albumin/Globulin Ratio 03/29/2021 2.2  1.2 - 2.2 Final   Bilirubin Total 03/29/2021 0.4  0.0 - 1.2 mg/dL  Final   Alkaline Phosphatase 03/29/2021 99  44 - 121 IU/L Final   AST 03/29/2021 13  0 - 40 IU/L Final   ALT 03/29/2021 11  0 - 44 IU/L Final   Cholesterol, Total 03/29/2021 196  100 - 199 mg/dL Final   Triglycerides 03/29/2021 112  0 - 149 mg/dL Final   HDL 03/29/2021 50  >39 mg/dL Final   VLDL Cholesterol Cal 03/29/2021 20  5 - 40 mg/dL Final   LDL Chol Calc (NIH) 03/29/2021 126 (H)  0 - 99 mg/dL Final   Chol/HDL Ratio 03/29/2021 3.9  0.0 - 5.0 ratio Final   Comment:                                   T. Chol/HDL Ratio                                             Men  Women                               1/2 Avg.Risk  3.4    3.3                                   Avg.Risk  5.0    4.4                                2X Avg.Risk  9.6    7.1                                3X Avg.Risk 23.4   11.0     Allergies: Penicillins  PTA Medications: (Not in a hospital admission)   Medical Decision Making  Recommend overnight observation Consider facility based crisis center -We will restart home medications where appropriate    Recommendations  Based on my evaluation the patient appears to have an emergency medical condition for which I recommend the patient be transferred to the emergency department for further evaluation.  Derrill Center, NP 07/21/21  3:02 PM

## 2021-07-21 NOTE — ED Notes (Signed)
Declined lab stick at this time.

## 2021-07-21 NOTE — ED Triage Notes (Addendum)
Pt Cole Cannon presents to [facility] with SI and a plan to cut his wrist. Pt states that " I just need help". Pt was recently separated from his wife of 8 years, in September. Pt states that he was diagnosed with bipolar, major depressive disorder, schizoaffective disorder. Pt states that he took 1 gram of cocaine and drank 1 beer prior to coming to this facility. Pt states that he has been inconsistent with his medication but he did take it yesterday. Pt states that he has SI with a plan to cut his wrist. Pt denies HI and AVH. Pt is urgent.

## 2021-07-21 NOTE — ED Notes (Signed)
Pt asleep with even and unlabored respirations. No distress or discomfort noted. Pt remains safe on the unit. Will continue to monitor. 

## 2021-07-21 NOTE — ED Provider Notes (Signed)
Restarted home medications at starting dose, off medications for undisclosed amount of time. CIWA Ativan protocol initiated.  Reviewed with Dr Bronwen Betters.

## 2021-07-21 NOTE — Discharge Instructions (Signed)
Take all medications as prescribed. Keep all follow-up appointments as scheduled.  Do not consume alcohol or use illegal drugs while on prescription medications. Report any adverse effects from your medications to your primary care provider promptly.  In the event of recurrent symptoms or worsening symptoms, call 911, a crisis hotline, or go to the nearest emergency department for evaluation.   

## 2021-07-22 ENCOUNTER — Encounter (HOSPITAL_COMMUNITY): Payer: Self-pay | Admitting: Emergency Medicine

## 2021-07-22 DIAGNOSIS — F1410 Cocaine abuse, uncomplicated: Secondary | ICD-10-CM

## 2021-07-22 DIAGNOSIS — F319 Bipolar disorder, unspecified: Secondary | ICD-10-CM | POA: Diagnosis not present

## 2021-07-22 DIAGNOSIS — F331 Major depressive disorder, recurrent, moderate: Secondary | ICD-10-CM | POA: Diagnosis not present

## 2021-07-22 DIAGNOSIS — Z20822 Contact with and (suspected) exposure to covid-19: Secondary | ICD-10-CM | POA: Diagnosis not present

## 2021-07-22 LAB — LIPID PANEL
Cholesterol: 152 mg/dL (ref 0–200)
HDL: 32 mg/dL — ABNORMAL LOW (ref >40)
LDL Cholesterol: 101 mg/dL — ABNORMAL HIGH (ref 0–99)
Total CHOL/HDL Ratio: 4.8 RATIO
Triglycerides: 94 mg/dL (ref <150)
VLDL: 19 mg/dL (ref 0–40)

## 2021-07-22 LAB — CBC WITH DIFFERENTIAL/PLATELET
Abs Immature Granulocytes: 0.13 10*3/uL — ABNORMAL HIGH (ref 0.00–0.07)
Basophils Absolute: 0.1 10*3/uL (ref 0.0–0.1)
Basophils Relative: 1 %
Eosinophils Absolute: 0.4 10*3/uL (ref 0.0–0.5)
Eosinophils Relative: 3 %
HCT: 46.2 % (ref 39.0–52.0)
Hemoglobin: 16 g/dL (ref 13.0–17.0)
Immature Granulocytes: 1 %
Lymphocytes Relative: 31 %
Lymphs Abs: 3.6 10*3/uL (ref 0.7–4.0)
MCH: 31.4 pg (ref 26.0–34.0)
MCHC: 34.6 g/dL (ref 30.0–36.0)
MCV: 90.8 fL (ref 80.0–100.0)
Monocytes Absolute: 0.5 10*3/uL (ref 0.1–1.0)
Monocytes Relative: 4 %
Neutro Abs: 7.1 10*3/uL (ref 1.7–7.7)
Neutrophils Relative %: 60 %
Platelets: 362 10*3/uL (ref 150–400)
RBC: 5.09 MIL/uL (ref 4.22–5.81)
RDW: 12.4 % (ref 11.5–15.5)
WBC: 11.8 10*3/uL — ABNORMAL HIGH (ref 4.0–10.5)
nRBC: 0 % (ref 0.0–0.2)

## 2021-07-22 NOTE — ED Notes (Signed)
Pt is sleeping at this time. He has a moist cough.

## 2021-07-22 NOTE — ED Notes (Signed)
Pt sleeping at present, no distress noted. Respirations even & unlabored.  Monitoring for safety. 

## 2021-07-22 NOTE — ED Notes (Signed)
[facility] called to collect STAT specimens and to deliver to [facility]. ?

## 2021-07-22 NOTE — Progress Notes (Signed)
CSW advised that [facility] is reviewing pt. Facility updated labs. CSW will fax.   Maryjean Ka, MSW, LCSWA 07/22/2021 1:17 PM

## 2021-07-22 NOTE — ED Notes (Signed)
Pt offered spaghetti dinner and broccoli/Apple juice

## 2021-07-22 NOTE — ED Notes (Signed)
Pt is sleep quietly. Will continue to monitor him.

## 2021-07-22 NOTE — ED Notes (Signed)
Pt in bed sleeping in [facility] continuous assessment. Respirations even and unlabored with no signs of acute distress. Will continue to monitor for safety. °

## 2021-07-22 NOTE — ED Notes (Signed)
Pt is sleep quietly.  

## 2021-07-22 NOTE — Progress Notes (Signed)
[facility]  CSW called [facility] [phone] who advised that if pt needed to detox from alcohol then pt would need to be under observation in a hospital setting for at least 7 days and be medically cleared but pt can enter into their residential program after medically cleared.  If pt's drug of choice is not alcohol and pt does not require medical observation then pt can all back and leave a massage with Sharyn Lull to schedule an intake appointment for Monday.  Provider Ricky Ala, NP advised that she would communicate with pt and follow back up with coordination of services for this pt.  Benjaman Kindler, MSW, [facility] 07/22/2021 11:16 AM

## 2021-07-22 NOTE — Progress Notes (Addendum)
Inpatient Behavioral Health Placement  Pt meets inpatient criteria per Hillery Jacks, NP. There are no apporioriate beds at [facility].  Referral was sent to the following facilities;    Destination Service Provider Address Phone Fax  [facility]  [address], [place] [address] [phone]  [facility] [address]  [address], [place] [address] [phone]  [facility]  [address] [place], [place] [address] [phone]  [facility]  [address] [place] [address] [phone]  [facility]  [address] [place] [address] [phone]  [facility]  [address] [place] [address] [phone]  [facility]  [address] [place] [address] [phone]  [facility]  [address] [place] [address] [phone]  [facility]  [address] [place] [address] [phone]  [facility]  [address] [place] [address] [phone]  [facility]  [address], [place] [address] [phone]  [facility]  [address], [place] [address] [phone]  [facility]  [address] [place] [address] [phone]  [facility]  [address], [place] [address] [phone]  [facility]  [address] [place] [address] [phone]  [facility]  [address] [place] [address] [phone]  [facility]  [address], [place] [address]  [phone]  [facility]  [address], [place] [address] [phone]    Situation ongoing,  CSW will follow up.   Maryjean Ka, MSW, LCSWA 07/22/2021  @ 12:01 PM

## 2021-07-22 NOTE — ED Notes (Signed)
Pt is currently on the phone. 

## 2021-07-22 NOTE — ED Notes (Signed)
Pt was given chicken/dumplings and apple for lunch.

## 2021-07-22 NOTE — ED Provider Notes (Signed)
Behavioral Health Progress Note  Date and Time: 07/22/2021 2:34 PM Name: Cole Cannon MRN:  [id number]  Subjective: Vaughan Basta was seen and evaluated this morning he continues to endorse depression passive suicidal ideations and generalized anxiety.  Patient's home medication was restarted on admission.  NP spoke to [place] regarding substance abuse treatment facility.  Patient is currently under review at [facility] for inpatient admission. Patient to follow-up with [facility] on Monday, 07/24/2021. Patient was placed on detox protocol. Staff to continue to monitor for safety.   Per admission assessment note: Pt is a 61 yo male who [facility] to [facility] voluntarily and unaccompanied reporting SI and a plan to cut his wrist. Pt states that " I just need help". Pt was recently separated from his wife of 8 years, in September, 2022. Pt states that he has been diagnosed with bipolar d/o and schizoaffective disorder.     Diagnosis:  Final diagnoses:  Moderate episode of recurrent major depressive disorder (HCC)  Cocaine abuse ([place])    Total Time spent with patient: 15 minutes  Past Psychiatric History:  Past Medical History:  Past Medical History:  Diagnosis Date   Asthma    Depression    Heart attack ([place])    Hypertension    Seizures ([place])     Family History:  Family History  Problem Relation Age of Onset   Cancer Sister    Cancer Brother    Family Psychiatric  History:  Social History:  Social History   Substance and Sexual Activity  Alcohol Use Yes   Alcohol/week: 3.0 standard drinks   Types: 3 Shots of liquor per week     Social History   Substance and Sexual Activity  Drug Use Yes   Types: Marijuana    Social History   Socioeconomic History   Marital status: Legally Separated    Spouse name: Not on file   Number of children: Not on file   Years of education: Not on file   Highest education level: Not on file  Occupational History   Not on file  Tobacco Use   Smoking  status: Every Day    Packs/day: 1.00    Types: Cigarettes   Smokeless tobacco: Never  Substance and Sexual Activity   Alcohol use: Yes    Alcohol/week: 3.0 standard drinks    Types: 3 Shots of liquor per week   Drug use: Yes    Types: Marijuana   Sexual activity: Not Currently  Other Topics Concern   Not on file  Social History Narrative   Not on file   Social Determinants of Health   Financial Resource Strain: Not on file  Food Insecurity: Not on file  Transportation Needs: Not on file  Physical Activity: Not on file  Stress: Not on file  Social Connections: Not on file   SDOH:  SDOH Screenings   Alcohol Screen: Not on file  Depression (PHQ2-9): Medium Risk   PHQ-2 Score: 9  Financial Resource Strain: Not on file  Food Insecurity: Not on file  Housing: Not on file  Physical Activity: Not on file  Social Connections: Not on file  Stress: Not on file  Tobacco Use: High Risk   Smoking Tobacco Use: Every Day   Smokeless Tobacco Use: Never   Passive Exposure: Not on file  Transportation Needs: Not on file   Additional Social History:    Pain Medications: see MAR Prescriptions: see MAR Over the Counter: see MAR History of alcohol / drug use?: Yes  Longest period of sobriety (when/how long): unknown Negative Consequences of Use: Personal relationships, Financial Name of Substance 1: cocaine 1 - Age of First Use: unknown 1 - Amount (size/oz): varies 1 - Frequency: daily 1 - Duration: ongoing 1 - Last Use / Amount: today 07/21/21 1 - Method of Aquiring: unknown 1- Route of Use: snort Name of Substance 2: alcohol 2 - Age of First Use: unknown 2 - Amount (size/oz): varies 2 - Frequency: daily 2 - Duration: ongoing 2 - Last Use / Amount: today 07/21/21 2 - Method of Aquiring: purchase 2 - Route of Substance Use: drink/oral Name of Substance 3: cannabis 3 - Age of First Use: unknown 3 - Amount (size/oz): varies 3 - Frequency: daily 3 - Duration: ongoing 3 -  Last Use / Amount: today 07/21/21 3 - Method of Aquiring: friend 3 - Route of Substance Use: smoke              Sleep: Good  Appetite:  Fair  Current Medications:  Current Facility-Administered Medications  Medication Dose Route Frequency Provider Last Rate Last Admin   acetaminophen (TYLENOL) tablet 650 mg  650 mg Oral Q6H PRN Derrill Center, NP       alum & mag hydroxide-simeth (MAALOX/MYLANTA) 200-200-20 MG/5ML suspension 30 mL  30 mL Oral Q4H PRN Derrill Center, NP       amLODipine (NORVASC) tablet 5 mg  5 mg Oral Daily Lucky Rathke, FNP   5 mg at 07/22/21 9509   carbamazepine (TEGRETOL XR) 12 hr tablet 100 mg  100 mg Oral BID Lucky Rathke, FNP   100 mg at 07/22/21 0923   citalopram (CELEXA) tablet 10 mg  10 mg Oral Daily Lucky Rathke, FNP   10 mg at 07/22/21 3267   hydrOXYzine (ATARAX) tablet 25 mg  25 mg Oral TID PRN Derrill Center, NP   25 mg at 07/21/21 2141   lisinopril (ZESTRIL) tablet 5 mg  5 mg Oral Daily Lucky Rathke, FNP   5 mg at 07/22/21 1245   loperamide (IMODIUM) capsule 2-4 mg  2-4 mg Oral PRN Lucky Rathke, FNP       LORazepam (ATIVAN) tablet 1 mg  1 mg Oral Q6H PRN Lucky Rathke, FNP   1 mg at 07/21/21 1714   magnesium hydroxide (MILK OF MAGNESIA) suspension 30 mL  30 mL Oral Daily PRN Derrill Center, NP       multivitamin with minerals tablet 1 tablet  1 tablet Oral Daily Lucky Rathke, FNP   1 tablet at 07/22/21 0923   ondansetron (ZOFRAN-ODT) disintegrating tablet 4 mg  4 mg Oral Q6H PRN Lucky Rathke, FNP       thiamine tablet 100 mg  100 mg Oral Daily Lucky Rathke, FNP   100 mg at 07/22/21 8099   traZODone (DESYREL) tablet 50 mg  50 mg Oral QHS PRN Derrill Center, NP       Current Outpatient Medications  Medication Sig Dispense Refill   amLODipine (NORVASC) 10 MG tablet Take 1 tablet (10 mg total) by mouth daily. 30 tablet 2   carbamazepine (TEGRETOL) 200 MG tablet TAKE ONE TABLET BY MOUTH TWICE DAILY @ 9AM & 5PM 60 tablet 0   citalopram (CELEXA)  20 MG tablet TAKE ONE TABLET BY MOUTH DAILY AT 9AM 30 tablet 0   lisinopril (ZESTRIL) 20 MG tablet TAKE ONE TABLET BY MOUTH DAILY AT 9AM 30 tablet 1   metoprolol  tartrate (LOPRESSOR) 100 MG tablet TAKE ONE TABLET BY MOUTH TWICE DAILY @ 9AM & 5PM WITH FOOD 60 tablet 0    Labs  Lab Results:  Admission on 07/21/2021  Component Date Value Ref Range Status   SARS Coronavirus 2 by RT PCR 07/21/2021 NEGATIVE  NEGATIVE Final   Comment: (NOTE) SARS-CoV-2 target nucleic acids are NOT DETECTED.  The SARS-CoV-2 RNA is generally detectable in upper respiratory specimens during the acute phase of infection. The lowest concentration of SARS-CoV-2 viral copies this assay can detect is 138 copies/mL. A negative result does not preclude SARS-Cov-2 infection and should not be used as the sole basis for treatment or other patient management decisions. A negative result may occur with  improper specimen collection/handling, submission of specimen other than nasopharyngeal swab, presence of viral mutation(s) within the areas targeted by this assay, and inadequate number of viral copies(<138 copies/mL). A negative result must be combined with clinical observations, patient history, and epidemiological information. The expected result is Negative.  Fact Sheet for Patients:  EntrepreneurPulse.com.au  Fact Sheet for Healthcare Providers:  IncredibleEmployment.be  This test is no                          t yet approved or cleared by the [place] FDA and  has been authorized for detection and/or diagnosis of SARS-CoV-2 by FDA under an Emergency Use Authorization (EUA). This EUA will remain  in effect (meaning this test can be used) for the duration of the COVID-19 declaration under Section 564(b)(1) of the Act, 21 U.S.C.section 360bbb-3(b)(1), unless the authorization is terminated  or revoked sooner.       Influenza A by PCR 07/21/2021 NEGATIVE  NEGATIVE Final    Influenza B by PCR 07/21/2021 NEGATIVE  NEGATIVE Final   Comment: (NOTE) The Xpert Xpress SARS-CoV-2/FLU/RSV plus assay is intended as an aid in the diagnosis of influenza from Nasopharyngeal swab specimens and should not be used as a sole basis for treatment. Nasal washings and aspirates are unacceptable for Xpert Xpress SARS-CoV-2/FLU/RSV testing.  Fact Sheet for Patients: EntrepreneurPulse.com.au  Fact Sheet for Healthcare Providers: IncredibleEmployment.be  This test is not yet approved or cleared by the [place] FDA and has been authorized for detection and/or diagnosis of SARS-CoV-2 by FDA under an Emergency Use Authorization (EUA). This EUA will remain in effect (meaning this test can be used) for the duration of the COVID-19 declaration under Section 564(b)(1) of the Act, 21 U.S.C. section 360bbb-3(b)(1), unless the authorization is terminated or revoked.  Performed at [facility], [place] [address], [place], [place] [address]    WBC 07/22/2021 11.8 (H)  4.0 - 10.5 K/uL Final   RBC 07/22/2021 5.09  4.22 - 5.81 MIL/uL Final   Hemoglobin 07/22/2021 16.0  13.0 - 17.0 g/dL Final   HCT 07/22/2021 46.2  39.0 - 52.0 % Final   MCV 07/22/2021 90.8  80.0 - 100.0 fL Final   MCH 07/22/2021 31.4  26.0 - 34.0 pg Final   MCHC 07/22/2021 34.6  30.0 - 36.0 g/dL Final   RDW 07/22/2021 12.4  11.5 - 15.5 % Final   Platelets 07/22/2021 362  150 - 400 K/uL Final   nRBC 07/22/2021 0.0  0.0 - 0.2 % Final   Neutrophils Relative % 07/22/2021 60  % Final   Neutro Abs 07/22/2021 7.1  1.7 - 7.7 K/uL Final   Lymphocytes Relative 07/22/2021 31  % Final   Lymphs Abs 07/22/2021  3.6  0.7 - 4.0 K/uL Final   Monocytes Relative 07/22/2021 4  % Final   Monocytes Absolute 07/22/2021 0.5  0.1 - 1.0 K/uL Final   Eosinophils Relative 07/22/2021 3  % Final   Eosinophils Absolute 07/22/2021 0.4  0.0 - 0.5 K/uL Final   Basophils Relative 07/22/2021 1  % Final    Basophils Absolute 07/22/2021 0.1  0.0 - 0.1 K/uL Final   Immature Granulocytes 07/22/2021 1  % Final   Abs Immature Granulocytes 07/22/2021 0.13 (H)  0.00 - 0.07 K/uL Final   Performed at [facility], [place] [address] [place], [place] [address]   Sodium 07/21/2021 137  135 - 145 mmol/L Final   Potassium 07/21/2021 3.8  3.5 - 5.1 mmol/L Final   Chloride 07/21/2021 103  98 - 111 mmol/L Final   CO2 07/21/2021 22  22 - 32 mmol/L Final   Glucose, Bld 07/21/2021 102 (H)  70 - 99 mg/dL Final   Glucose reference range applies only to samples taken after fasting for at least 8 hours.   BUN 07/21/2021 12  6 - 20 mg/dL Final   Creatinine, Ser 07/21/2021 1.36 (H)  0.61 - 1.24 mg/dL Final   Calcium 07/21/2021 9.5  8.9 - 10.3 mg/dL Final   Total Protein 07/21/2021 6.9  6.5 - 8.1 g/dL Final   Albumin 07/21/2021 3.4 (L)  3.5 - 5.0 g/dL Final   AST 07/21/2021 21  15 - 41 U/L Final   ALT 07/21/2021 15  0 - 44 U/L Final   Alkaline Phosphatase 07/21/2021 109  38 - 126 U/L Final   Total Bilirubin 07/21/2021 0.7  0.3 - 1.2 mg/dL Final   GFR, Estimated 07/21/2021 60 (L)  >60 mL/min Final   Comment: (NOTE) Calculated using the CKD-EPI Creatinine Equation (2021)    Anion gap 07/21/2021 12  5 - 15 Final   Performed at [place] [address], [place], [place] [address]   Cholesterol 07/22/2021 152  0 - 200 mg/dL Final   Triglycerides 07/22/2021 94  <150 mg/dL Final   HDL 07/22/2021 32 (L)  >40 mg/dL Final   Total CHOL/HDL Ratio 07/22/2021 4.8  RATIO Final   VLDL 07/22/2021 19  0 - 40 mg/dL Final   LDL Cholesterol 07/22/2021 101 (H)  0 - 99 mg/dL Final   Comment:        Total Cholesterol/HDL:CHD Risk Coronary Heart Disease Risk Table                     Men   Women  1/2 Average Risk   3.4   3.3  Average Risk       5.0   4.4  2 X Average Risk   9.6   7.1  3 X Average Risk  23.4   11.0        Use the calculated Patient Ratio above and the CHD Risk Table to determine the patient's CHD  Risk.        ATP III CLASSIFICATION (LDL):  <100     mg/dL   Optimal  100-129  mg/dL   Near or Above                    Optimal  130-159  mg/dL   Borderline  160-189  mg/dL   High  >190     mg/dL   Very High Performed at [place] [address] [place], [place] [address]  POC Amphetamine UR 07/21/2021 Positive (A)  NONE DETECTED (Cut Off Level 1000 ng/mL) Final   POC Secobarbital (BAR) 07/21/2021 None Detected  NONE DETECTED (Cut Off Level 300 ng/mL) Final   POC Buprenorphine (BUP) 07/21/2021 None Detected  NONE DETECTED (Cut Off Level 10 ng/mL) Final   POC Oxazepam (BZO) 07/21/2021 None Detected  NONE DETECTED (Cut Off Level 300 ng/mL) Final   POC Cocaine UR 07/21/2021 Positive (A)  NONE DETECTED (Cut Off Level 300 ng/mL) Final   POC Methamphetamine UR 07/21/2021 Positive (A)  NONE DETECTED (Cut Off Level 1000 ng/mL) Final   POC Morphine 07/21/2021 None Detected  NONE DETECTED (Cut Off Level 300 ng/mL) Final   POC Oxycodone UR 07/21/2021 None Detected  NONE DETECTED (Cut Off Level 100 ng/mL) Final   POC Methadone UR 07/21/2021 None Detected  NONE DETECTED (Cut Off Level 300 ng/mL) Final   POC Marijuana UR 07/21/2021 Positive (A)  NONE DETECTED (Cut Off Level 50 ng/mL) Final   SARS Coronavirus 2 Ag 07/21/2021 Negative  Negative Final  Office Visit on 03/29/2021  Component Date Value Ref Range Status   POC Glucose 03/29/2021 120 (A)  70 - 99 mg/dl Final   Hemoglobin A1C 03/29/2021 5.7 (A)  4.0 - 5.6 % Final   Glucose, UA 03/29/2021 negative  negative mg/dL Final   Bilirubin, UA 03/29/2021 negative  negative Final   Ketones, POC UA 03/29/2021 negative  negative mg/dL Final   Spec Grav, UA 03/29/2021 1.020  1.010 - 1.025 Final   Blood, UA 03/29/2021 negative  negative Final   pH, UA 03/29/2021 6.0  5.0 - 8.0 Final   POC PROTEIN,UA 03/29/2021 negative  negative, trace Final   Urobilinogen, UA 03/29/2021 0.2  0.2 or 1.0 E.U./dL Final   Nitrite, UA 03/29/2021 Negative   Negative Final   Leukocytes, UA 03/29/2021 Negative  Negative Final   HIV Screen 4th Generation wRfx 03/29/2021 Non Reactive  Non Reactive Final   Comment: HIV Negative HIV-1/HIV-2 antibodies and HIV-1 p24 antigen were NOT detected. There is no laboratory evidence of HIV infection.    WBC 03/29/2021 6.8  3.4 - 10.8 x10E3/uL Final   RBC 03/29/2021 5.06  4.14 - 5.80 x10E6/uL Final   Hemoglobin 03/29/2021 15.4  13.0 - 17.7 g/dL Final   Hematocrit 03/29/2021 46.3  37.5 - 51.0 % Final   MCV 03/29/2021 92  79 - 97 fL Final   MCH 03/29/2021 30.4  26.6 - 33.0 pg Final   MCHC 03/29/2021 33.3  31.5 - 35.7 g/dL Final   RDW 03/29/2021 11.8  11.6 - 15.4 % Final   Platelets 03/29/2021 280  150 - 450 x10E3/uL Final   Neutrophils 03/29/2021 51  Not Estab. % Final   Lymphs 03/29/2021 41  Not Estab. % Final   Monocytes 03/29/2021 5  Not Estab. % Final   Eos 03/29/2021 2  Not Estab. % Final   Basos 03/29/2021 1  Not Estab. % Final   Neutrophils Absolute 03/29/2021 3.5  1.4 - 7.0 x10E3/uL Final   Lymphocytes Absolute 03/29/2021 2.8  0.7 - 3.1 x10E3/uL Final   Monocytes Absolute 03/29/2021 0.3  0.1 - 0.9 x10E3/uL Final   EOS (ABSOLUTE) 03/29/2021 0.1  0.0 - 0.4 x10E3/uL Final   Basophils Absolute 03/29/2021 0.1  0.0 - 0.2 x10E3/uL Final   Immature Granulocytes 03/29/2021 0  Not Estab. % Final   Immature Grans (Abs) 03/29/2021 0.0  0.0 - 0.1 x10E3/uL Final   Glucose 03/29/2021 110 (H)  70 - 99 mg/dL  Final   BUN 03/29/2021 15  8 - 27 mg/dL Final   Creatinine, Ser 03/29/2021 1.17  0.76 - 1.27 mg/dL Final   eGFR 03/29/2021 71  >59 mL/min/1.73 Final   BUN/Creatinine Ratio 03/29/2021 13  10 - 24 Final   Sodium 03/29/2021 141  134 - 144 mmol/L Final   Potassium 03/29/2021 4.4  3.5 - 5.2 mmol/L Final   Chloride 03/29/2021 103  96 - 106 mmol/L Final   CO2 03/29/2021 23  20 - 29 mmol/L Final   Calcium 03/29/2021 9.7  8.6 - 10.2 mg/dL Final   Total Protein 03/29/2021 6.7  6.0 - 8.5 g/dL Final   Albumin  03/29/2021 4.6  3.8 - 4.9 g/dL Final   Globulin, Total 03/29/2021 2.1  1.5 - 4.5 g/dL Final   Albumin/Globulin Ratio 03/29/2021 2.2  1.2 - 2.2 Final   Bilirubin Total 03/29/2021 0.4  0.0 - 1.2 mg/dL Final   Alkaline Phosphatase 03/29/2021 99  44 - 121 IU/L Final   AST 03/29/2021 13  0 - 40 IU/L Final   ALT 03/29/2021 11  0 - 44 IU/L Final   Cholesterol, Total 03/29/2021 196  100 - 199 mg/dL Final   Triglycerides 03/29/2021 112  0 - 149 mg/dL Final   HDL 03/29/2021 50  >39 mg/dL Final   VLDL Cholesterol Cal 03/29/2021 20  5 - 40 mg/dL Final   LDL Chol Calc (NIH) 03/29/2021 126 (H)  0 - 99 mg/dL Final   Chol/HDL Ratio 03/29/2021 3.9  0.0 - 5.0 ratio Final   Comment:                                   T. Chol/HDL Ratio                                             Men  Women                               1/2 Avg.Risk  3.4    3.3                                   Avg.Risk  5.0    4.4                                2X Avg.Risk  9.6    7.1                                3X Avg.Risk 23.4   11.0     Blood Alcohol level:  No results found for: [facility]  Metabolic Disorder Labs: Lab Results  Component Value Date   HGBA1C 5.7 (A) 03/29/2021   No results found for: PROLACTIN Lab Results  Component Value Date   CHOL 152 07/22/2021   TRIG 94 07/22/2021   HDL 32 (L) 07/22/2021   CHOLHDL 4.8 07/22/2021   VLDL 19 07/22/2021   LDLCALC 101 (H) 07/22/2021   LDLCALC 126 (H) 03/29/2021    Therapeutic Lab Levels: No results found for: LITHIUM No results found  for: VALPROATE No components found for:  CBMZ  Physical Findings   PHQ2-9    Flowsheet Row ED from 07/21/2021 in [facility] Office Visit from 03/29/2021 in [place]  PHQ-2 Total Score 2 4  PHQ-9 Total Score 9 [place] ED from 07/21/2021 in [facility] Office Visit from 03/29/2021 in [place] CATEGORY High  Risk Error: Question 6 not populated        Musculoskeletal  Strength & Muscle Tone: within normal limits Gait & Station: normal Patient leans: N/A  Psychiatric Specialty Exam  Presentation  General Appearance: Appropriate for Environment  Eye Contact:Good  Speech:Clear and Coherent  Speech Volume:Normal  Handedness:Right   Mood and Affect  Mood:Anxious; Depressed  Affect:Congruent   Thought Process  Thought Processes:Coherent  Descriptions of Associations:Intact  Orientation:Full (Time, Place and Person)  Thought Content:Logical  Diagnosis of Schizophrenia or Schizoaffective disorder in past: Yes    Hallucinations:Hallucinations: None  Ideas of Reference:None  Suicidal Thoughts:Suicidal Thoughts: Yes, Passive SI Passive Intent and/or Plan: Without Intent; Without Plan  Homicidal Thoughts:Homicidal Thoughts: No   Sensorium  Memory:Recent Fair; Remote Good; Immediate Good  Judgment:Fair  Insight:No data recorded  Executive Functions  Concentration:Fair  Attention Span:Good  [place]   Psychomotor Activity  Psychomotor Activity:Psychomotor Activity: Normal   Assets  Assets:Communication Skills; Housing   Sleep  Sleep:Sleep: Fair   Nutritional Assessment (For OBS and FBC admissions only) Has the patient had a weight loss or gain of 10 pounds or more in the last 3 months?: No Has the patient had a decrease in food intake/or appetite?: No Does the patient have dental problems?: No Does the patient have eating habits or behaviors that may be indicators of an eating disorder including binging or inducing vomiting?: No Has the patient recently lost weight without trying?: 0 Has the patient been eating poorly because of a decreased appetite?: 0 Malnutrition Screening Tool Score: 0    Physical Exam  Physical Exam Vitals and nursing note reviewed.  Cardiovascular:     Rate and Rhythm:  Tachycardia present.  Psychiatric:        Mood and Affect: Mood normal.        Behavior: Behavior normal.        Thought Content: Thought content normal.   Review of Systems  Psychiatric/Behavioral:  Positive for depression, substance abuse and suicidal ideas. Negative for hallucinations. The patient is nervous/anxious.   All other systems reviewed and are negative. Blood pressure (!) 128/95, pulse 86, temperature 97.9 F (36.6 C), resp. rate 20, SpO2 99 %. There is no height or weight on file to calculate BMI.  Treatment Plan Summary: Daily contact with patient to assess and evaluate symptoms and progress in treatment, Medication management, and Plan CSW to continue seeking inpatient follow-up.   Derrill Center, NP 07/22/2021 2:34 PM

## 2021-07-22 NOTE — ED Notes (Signed)
Pt vitals were taken and pt is content.

## 2021-07-22 NOTE — ED Notes (Signed)
Pt decline lab stick at this time.

## 2021-07-22 NOTE — ED Notes (Signed)
Pt is sleep with a cough at times.

## 2021-07-22 NOTE — ED Notes (Signed)
Attempted to collect blood specimens, unsuccessful x1 stick to the left wrist. Pt tolerated well.

## 2021-07-22 NOTE — ED Notes (Signed)
Pt A&O x 4, resting at present, no distress noted.  Calm & cooperative. Respirations even & unlabored.  Monitoring for safety.

## 2021-07-22 NOTE — ED Notes (Signed)
Pt was given breakfast. Muffin, cereal , and milk.

## 2021-07-22 NOTE — Progress Notes (Addendum)
Addendum:  Pt is fully accepted to [facility] for Monday 07/24/21.  Acceptance for Monday after 8am; Bed Assignment 2 West  Accepting Attending: Dr. Otho Perl. Phone number for report:[phone]. Nursing communicated with in regards to pt's bed offer with [facility] onMonday: Dickie La, RN, Laretta Alstrom, LPN, Hillery Jacks, NP, and De Burrs, RN.  Inpatient Behavioral Health Placement Update  CSW received a phone call from [facility] with [facility] reporting that pt is pending acceptance awaiting to speak with nursing: PENDING Acceptance for Monday after 8am; Bed Assignment 2 West   Accepting Attending: Dr. Otho Perl   Phone number for report:[phone]  CSW sent updated labs [facility]. CSW confirmed that labs and information is still in review but pt looks good and more than likely can offer. Intake will follow back up via phone.

## 2021-07-23 ENCOUNTER — Encounter (HOSPITAL_COMMUNITY): Payer: Self-pay | Admitting: Psychiatry

## 2021-07-23 ENCOUNTER — Other Ambulatory Visit: Payer: Self-pay

## 2021-07-23 ENCOUNTER — Inpatient Hospital Stay (HOSPITAL_COMMUNITY)
Admission: AD | Admit: 2021-07-23 | Discharge: 2021-07-29 | DRG: 885 | Disposition: A | Discharge status: Home / Self Care | Payer: Medicare Other | Source: Intra-hospital | Attending: Psychiatry | Admitting: Psychiatry

## 2021-07-23 ENCOUNTER — Encounter (HOSPITAL_COMMUNITY): Payer: Self-pay | Admitting: Emergency Medicine

## 2021-07-23 DIAGNOSIS — I10 Essential (primary) hypertension: Secondary | ICD-10-CM | POA: Diagnosis present

## 2021-07-23 DIAGNOSIS — J45909 Unspecified asthma, uncomplicated: Secondary | ICD-10-CM | POA: Diagnosis present

## 2021-07-23 DIAGNOSIS — I252 Old myocardial infarction: Secondary | ICD-10-CM | POA: Diagnosis not present

## 2021-07-23 DIAGNOSIS — Z720 Tobacco use: Secondary | ICD-10-CM | POA: Diagnosis present

## 2021-07-23 DIAGNOSIS — Z880 Allergy status to penicillin: Secondary | ICD-10-CM | POA: Diagnosis not present

## 2021-07-23 DIAGNOSIS — F17210 Nicotine dependence, cigarettes, uncomplicated: Secondary | ICD-10-CM | POA: Diagnosis present

## 2021-07-23 DIAGNOSIS — Z635 Disruption of family by separation and divorce: Secondary | ICD-10-CM

## 2021-07-23 DIAGNOSIS — Z5901 Sheltered homelessness: Secondary | ICD-10-CM

## 2021-07-23 DIAGNOSIS — F1210 Cannabis abuse, uncomplicated: Secondary | ICD-10-CM | POA: Diagnosis present

## 2021-07-23 DIAGNOSIS — F1410 Cocaine abuse, uncomplicated: Secondary | ICD-10-CM | POA: Diagnosis present

## 2021-07-23 DIAGNOSIS — I219 Acute myocardial infarction, unspecified: Secondary | ICD-10-CM

## 2021-07-23 DIAGNOSIS — F1910 Other psychoactive substance abuse, uncomplicated: Secondary | ICD-10-CM | POA: Diagnosis present

## 2021-07-23 DIAGNOSIS — Z9151 Personal history of suicidal behavior: Secondary | ICD-10-CM

## 2021-07-23 DIAGNOSIS — F329 Major depressive disorder, single episode, unspecified: Secondary | ICD-10-CM | POA: Diagnosis present

## 2021-07-23 DIAGNOSIS — Z818 Family history of other mental and behavioral disorders: Secondary | ICD-10-CM | POA: Diagnosis not present

## 2021-07-23 DIAGNOSIS — F419 Anxiety disorder, unspecified: Secondary | ICD-10-CM | POA: Diagnosis present

## 2021-07-23 DIAGNOSIS — Z79899 Other long term (current) drug therapy: Secondary | ICD-10-CM

## 2021-07-23 DIAGNOSIS — F333 Major depressive disorder, recurrent, severe with psychotic symptoms: Secondary | ICD-10-CM | POA: Diagnosis not present

## 2021-07-23 DIAGNOSIS — F250 Schizoaffective disorder, bipolar type: Secondary | ICD-10-CM | POA: Diagnosis present

## 2021-07-23 DIAGNOSIS — F319 Bipolar disorder, unspecified: Secondary | ICD-10-CM | POA: Diagnosis not present

## 2021-07-23 DIAGNOSIS — R7303 Prediabetes: Secondary | ICD-10-CM | POA: Diagnosis present

## 2021-07-23 DIAGNOSIS — F209 Schizophrenia, unspecified: Secondary | ICD-10-CM | POA: Diagnosis present

## 2021-07-23 DIAGNOSIS — R45851 Suicidal ideations: Secondary | ICD-10-CM | POA: Diagnosis present

## 2021-07-23 DIAGNOSIS — Z20822 Contact with and (suspected) exposure to covid-19: Secondary | ICD-10-CM | POA: Diagnosis not present

## 2021-07-23 MED ORDER — INFLUENZA VAC SPLIT QUAD 0.5 ML IM SUSY: 0.5000 mL | PREFILLED_SYRINGE | INTRAMUSCULAR | Status: DC

## 2021-07-23 MED ORDER — LOPERAMIDE HCL 2 MG PO CAPS: 2.0000 mg | ORAL_CAPSULE | ORAL | Status: AC | PRN

## 2021-07-23 MED ORDER — CARBAMAZEPINE ER 100 MG PO TB12
300.0000 mg | ORAL_TABLET | Freq: Two times a day (BID) | ORAL | Status: DC
  Administered 2021-07-23 – 2021-07-24 (×2): 300 mg via ORAL
  Filled 2021-07-23 (×7): qty 1

## 2021-07-23 MED ORDER — TRAZODONE HCL 50 MG PO TABS
50.0000 mg | ORAL_TABLET | Freq: Every evening | ORAL | Status: DC | PRN
  Administered 2021-07-23 – 2021-07-24 (×2): 50 mg via ORAL
  Filled 2021-07-23 (×2): qty 1

## 2021-07-23 MED ORDER — THIAMINE HCL 100 MG/ML IJ SOLN: 100.0000 mg | Freq: Once | INTRAMUSCULAR | Status: AC

## 2021-07-23 MED ORDER — ADULT MULTIVITAMIN W/MINERALS CH
1.0000 | ORAL_TABLET | Freq: Every day | ORAL | Status: DC
  Administered 2021-07-24 – 2021-07-29 (×6): 1 via ORAL
  Filled 2021-07-23 (×9): qty 1

## 2021-07-23 MED ORDER — ONDANSETRON 4 MG PO TBDP: 4.0000 mg | ORAL_TABLET | Freq: Four times a day (QID) | ORAL | Status: AC | PRN

## 2021-07-23 MED ORDER — CITALOPRAM HYDROBROMIDE 10 MG PO TABS
10.0000 mg | ORAL_TABLET | Freq: Every day | ORAL | Status: DC
  Administered 2021-07-24: 10 mg via ORAL
  Filled 2021-07-23 (×4): qty 1

## 2021-07-23 MED ORDER — PNEUMOCOCCAL VAC POLYVALENT 25 MCG/0.5ML IJ INJ: 0.5000 mL | INJECTION | INTRAMUSCULAR | Status: DC

## 2021-07-23 MED ORDER — ALUM & MAG HYDROXIDE-SIMETH 200-200-20 MG/5ML PO SUSP: 30.0000 mL | ORAL | Status: DC | PRN

## 2021-07-23 MED ORDER — MAGNESIUM HYDROXIDE 400 MG/5ML PO SUSP
30.0000 mL | Freq: Every day | ORAL | Status: DC | PRN
  Administered 2021-07-27: 30 mL via ORAL
  Filled 2021-07-23: qty 30

## 2021-07-23 MED ORDER — THIAMINE HCL 100 MG PO TABS
100.0000 mg | ORAL_TABLET | Freq: Every day | ORAL | Status: DC
  Administered 2021-07-24 – 2021-07-29 (×6): 100 mg via ORAL
  Filled 2021-07-23 (×9): qty 1

## 2021-07-23 MED ORDER — LORAZEPAM 1 MG PO TABS: 1.0000 mg | ORAL_TABLET | Freq: Four times a day (QID) | ORAL | Status: AC | PRN

## 2021-07-23 MED ORDER — ACETAMINOPHEN 325 MG PO TABS
650.0000 mg | ORAL_TABLET | Freq: Four times a day (QID) | ORAL | Status: DC | PRN
  Administered 2021-07-23: 650 mg via ORAL
  Filled 2021-07-23: qty 2

## 2021-07-23 MED ORDER — HYDROXYZINE HCL 25 MG PO TABS
25.0000 mg | ORAL_TABLET | Freq: Four times a day (QID) | ORAL | Status: AC | PRN
  Administered 2021-07-24 – 2021-07-25 (×3): 25 mg via ORAL
  Filled 2021-07-23 (×3): qty 1

## 2021-07-23 NOTE — ED Notes (Signed)
Report called to Land at [facility]. Safe transport to be called at 1230.

## 2021-07-23 NOTE — ED Notes (Signed)
`  Patient is anxious aeb pacing back and forth. Patient sitting on side of bed holding head in his hands. Patient rocking from side to side. Patient quite and taking deep breaths aeb staff observation. Patient safe on unit with continued monitoring.

## 2021-07-23 NOTE — ED Notes (Signed)
Discharge instructions provided and Pt stated understanding. Pt alert, orient and ambulatory prior to d/c from facility. Personal belongings returned from locker number 22. Safe transport called for transportation services. Pt escorted to the sally port. Report previously called to Land at [facility]. Safety maintained.

## 2021-07-23 NOTE — Progress Notes (Signed)
Pt was accepted to [facility] 07/23/21; Bed Assignment 300-01 at 12:30.  Pt meets inpatient criteria per Hillery Jacks, NP  Attending Physician will be Dr.Hill  Report can be called to: -Adult unit: [phone]  Pt can arrive after 12:30 noon  Care team communication made with the following: [place] Core, Hillery Jacks, NP, Laretta Alstrom, LPV, Vernard Gambles, NP, [facility] Tommy Medal, NP, and Wallene Dales, RN.  CSW advised pre-admit completed.   CSW called [facility] and spoke with Tiffany with Intake to advised that pt no longer needs placement as of 07/23/21 due to pt being accepted at [facility] on this date.  Kelton Pillar, LCSWA 07/23/2021 @ 11:16 AM

## 2021-07-23 NOTE — Progress Notes (Addendum)
Patient is a 61 year old male who presented under voluntary admission for increasing SI with a plan to cut his wrist. Pt endorsed feelings of hopelessness, and voiced that he was needing help. Pt reported that he has been "drinking, doing drugs, all day, every day" , since September after a recent break up with his wife. Patient presented with sullen affect, depressed mood, tearful at times during admission interview and assessment. VS monitored and recorded. Skin check performed. Belongings searched and secured in locker. Admission paperwork completed and signed.  Patient was oriented to unit and schedule. Pt denies SI/HI/AVH at this time. PO fluids provided. Q 15 min checks initiated for safety.

## 2021-07-23 NOTE — ED Notes (Signed)
Safe Transport called to transport to [facility]. 

## 2021-07-23 NOTE — Progress Notes (Signed)
°   07/23/21 2030  Psych Admission Type (Psych Patients Only)  Admission Status Voluntary  Psychosocial Assessment  Patient Complaints None  Eye Contact Brief  Facial Expression Other (Comment) (WDL)  Affect Appropriate to circumstance  Speech Logical/coherent  Interaction Minimal  Motor Activity Other (Comment) (WDL)  Appearance/Hygiene Unremarkable  Behavior Characteristics Appropriate to situation  Mood Depressed  Thought Process  Coherency WDL  Content WDL  Delusions None reported or observed  Perception WDL  Hallucination None reported or observed  Judgment Impaired  Confusion None  Danger to Self  Current suicidal ideation? Denies  Self-Injurious Behavior No self-injurious ideation or behavior indicators observed or expressed   Agreement Not to Harm Self Yes  Description of Agreement Verbal  Danger to Others  Danger to Others None reported or observed

## 2021-07-23 NOTE — ED Notes (Signed)
Pt sleeping at present, respirations even & unlabored.  Monitoring for safety. No distress noted.

## 2021-07-23 NOTE — Tx Team (Signed)
Initial Treatment Plan 07/23/2021 4:51 PM Deiondre Harrower DTO:[id number]    PATIENT STRESSORS: Financial difficulties   Marital or family conflict   Substance abuse     PATIENT STRENGTHS: Ability for insight  Average or above average intelligence  Capable of independent living  Communication skills  Motivation for treatment/growth  Supportive family/friends    PATIENT IDENTIFIED PROBLEMS: Suicidal ideation    "Drinking and doing drugs every day all day since September. It isn't helping".    Hopelessness             DISCHARGE CRITERIA:  Improved stabilization in mood, thinking, and/or behavior Need for constant or close observation no longer present Reduction of life-threatening or endangering symptoms to within safe limits Verbal commitment to aftercare and medication compliance Withdrawal symptoms are absent or subacute and managed without 24-hour nursing intervention  PRELIMINARY DISCHARGE PLAN: Attend aftercare/continuing care group Attend 12-step recovery group Outpatient therapy  PATIENT/FAMILY INVOLVEMENT: This treatment plan has been presented to and reviewed with the patient, Donovan Persley, The patient has been given the opportunity to ask questions and make suggestions.  Shela Nevin, RN 07/23/2021, 4:51 PM

## 2021-07-23 NOTE — ED Provider Notes (Addendum)
FBC/OBS ASAP Discharge Summary  Date and Time: 07/23/2021 11:27 AM  Name: Cole Cannon  MRN:  [id number]   Discharge Diagnoses:  Final diagnoses:  Moderate episode of recurrent major depressive disorder (HCC)  Cocaine abuse (HCC)    Subjective: Cole Cannon was accepted to [facility].  Stay Summary: Per Admission assessment: Cole Cannon is a 61 yo male who presents to [facility] voluntarily and unaccompanied reporting SI and a plan to cut his wrist. Pt states that " I just need help". Pt was recently separated from his wife of 8 years, in September, 2022. Pt states that he has been diagnosed with bipolar d/o and schizoaffective disorder. Pt states that he took 1 gram of cocaine and drank 1 beer prior to coming to this facility. Pt reported regular use of cocaine, alcohol and cannabis. Pt states that he has been inconsistent with his prescribed medication but he did take it yesterday. Pt denies any hx of suicide attempts. Pt denies HI and AVH. Pt indicated his main stressors included marital issues (separation), his substance use/abuse and financial issues as a result.   Total Time spent with patient: 15 minutes  Past Psychiatric History:  Past Medical History:  Past Medical History:  Diagnosis Date   Asthma    Depression    Heart attack (HCC)    Hypertension    Seizures (HCC)    History reviewed. No pertinent surgical history. Family History:  Family History  Problem Relation Age of Onset   Cancer Sister    Cancer Brother    Family Psychiatric History: Social History:  Social History   Substance and Sexual Activity  Alcohol Use Yes   Alcohol/week: 3.0 standard drinks   Types: 3 Shots of liquor per week     Social History   Substance and Sexual Activity  Drug Use Yes   Types: Marijuana    Social History   Socioeconomic History   Marital status: Legally Separated    Spouse name: Not on file   Number of children: Not on file   Years of education: Not on file    Highest education level: Not on file  Occupational History   Not on file  Tobacco Use   Smoking status: Every Day    Packs/day: 1.00    Types: Cigarettes   Smokeless tobacco: Never  Substance and Sexual Activity   Alcohol use: Yes    Alcohol/week: 3.0 standard drinks    Types: 3 Shots of liquor per week   Drug use: Yes    Types: Marijuana   Sexual activity: Not Currently  Other Topics Concern   Not on file  Social History Narrative   Not on file   Social Determinants of Health   Financial Resource Strain: Not on file  Food Insecurity: Not on file  Transportation Needs: Not on file  Physical Activity: Not on file  Stress: Not on file  Social Connections: Not on file   SDOH:  SDOH Screenings   Alcohol Screen: Not on file  Depression (PHQ2-9): Medium Risk   PHQ-2 Score: 9  Financial Resource Strain: Not on file  Food Insecurity: Not on file  Housing: Not on file  Physical Activity: Not on file  Social Connections: Not on file  Stress: Not on file  Tobacco Use: High Risk   Smoking Tobacco Use: Every Day   Smokeless Tobacco Use: Never   Passive Exposure: Not on file  Transportation Needs: Not on file    Tobacco Cessation:  N/A, patient does  not currently use tobacco products  Current Medications:  Current Facility-Administered Medications  Medication Dose Route Frequency Provider Last Rate Last Admin   acetaminophen (TYLENOL) tablet 650 mg  650 mg Oral Q6H PRN Oneta Rack, NP       alum & mag hydroxide-simeth (MAALOX/MYLANTA) 200-200-20 MG/5ML suspension 30 mL  30 mL Oral Q4H PRN Oneta Rack, NP       amLODipine (NORVASC) tablet 5 mg  5 mg Oral Daily Lenard Lance, FNP   5 mg at 07/23/21 0957   carbamazepine (TEGRETOL XR) 12 hr tablet 100 mg  100 mg Oral BID Lenard Lance, FNP   100 mg at 07/23/21 0956   citalopram (CELEXA) tablet 10 mg  10 mg Oral Daily Lenard Lance, FNP   10 mg at 07/23/21 0957   hydrOXYzine (ATARAX) tablet 25 mg  25 mg Oral TID PRN  Oneta Rack, NP   25 mg at 07/22/21 2101   lisinopril (ZESTRIL) tablet 5 mg  5 mg Oral Daily Lenard Lance, FNP   5 mg at 07/23/21 0957   loperamide (IMODIUM) capsule 2-4 mg  2-4 mg Oral PRN Lenard Lance, FNP       LORazepam (ATIVAN) tablet 1 mg  1 mg Oral Q6H PRN Lenard Lance, FNP   1 mg at 07/21/21 1714   magnesium hydroxide (MILK OF MAGNESIA) suspension 30 mL  30 mL Oral Daily PRN Oneta Rack, NP       multivitamin with minerals tablet 1 tablet  1 tablet Oral Daily Lenard Lance, FNP   1 tablet at 07/23/21 0957   ondansetron (ZOFRAN-ODT) disintegrating tablet 4 mg  4 mg Oral Q6H PRN Lenard Lance, FNP       thiamine tablet 100 mg  100 mg Oral Daily Lenard Lance, FNP   100 mg at 07/23/21 0957   traZODone (DESYREL) tablet 50 mg  50 mg Oral QHS PRN Oneta Rack, NP       Current Outpatient Medications  Medication Sig Dispense Refill   amLODipine (NORVASC) 10 MG tablet Take 1 tablet (10 mg total) by mouth daily. 30 tablet 2   carbamazepine (TEGRETOL) 200 MG tablet TAKE ONE TABLET BY MOUTH TWICE DAILY @ 9AM & 5PM 60 tablet 0   citalopram (CELEXA) 20 MG tablet TAKE ONE TABLET BY MOUTH DAILY AT 9AM 30 tablet 0   lisinopril (ZESTRIL) 20 MG tablet TAKE ONE TABLET BY MOUTH DAILY AT 9AM 30 tablet 1   metoprolol tartrate (LOPRESSOR) 100 MG tablet TAKE ONE TABLET BY MOUTH TWICE DAILY @ 9AM & 5PM WITH FOOD 60 tablet 0    PTA Medications: (Not in a hospital admission)   Musculoskeletal  Strength & Muscle Tone: within normal limits Gait & Station: normal Patient leans: N/A  Psychiatric Specialty Exam  Presentation  General Appearance: Appropriate for Environment  Eye Contact:Good  Speech:Clear and Coherent  Speech Volume:Normal  Handedness:Right   Mood and Affect  Mood:Anxious; Depressed  Affect:Congruent   Thought Process  Thought Processes:Coherent  Descriptions of Associations:Intact  Orientation:Full (Time, Place and Person)  Thought Content:Logical   Diagnosis of Schizophrenia or Schizoaffective disorder in past: Yes    Hallucinations:No data recorded Ideas of Reference:None  Suicidal Thoughts:No data recorded Homicidal Thoughts:No data recorded  Sensorium  Memory:Recent Fair; Remote Good; Immediate Good  Judgment:Fair  Insight:No data recorded  Executive Functions  Concentration:Fair  Attention Span:Good  Recall:Fair  Fund of Knowledge:Fair  Language:Fair  Psychomotor Activity  Psychomotor Activity:No data recorded  Assets  Assets:Communication Skills; Housing   Sleep  Sleep:No data recorded  No data recorded  Physical Exam  Physical Exam Vitals reviewed.  HENT:     Head: Normocephalic.  Cardiovascular:     Rate and Rhythm: Normal rate.  Pulmonary:     Effort: Pulmonary effort is normal.     Breath sounds: Normal breath sounds.  Neurological:     Mental Status: He is alert and oriented to person, place, and time.  Psychiatric:        Attention and Perception: Attention normal.        Mood and Affect: Mood is anxious and depressed.        Speech: Speech normal.        Behavior: Behavior normal. Behavior is cooperative.        Thought Content: Thought content includes suicidal ideation.        Cognition and Memory: Cognition normal.        Judgment: Judgment normal.   Review of Systems  Eyes: Negative.   Cardiovascular: Negative.   Skin: Negative.   Psychiatric/Behavioral:  Positive for depression, substance abuse and suicidal ideas. The patient is nervous/anxious.   All other systems reviewed and are negative. Blood pressure (!) 128/97, pulse 86, temperature 98.1 F (36.7 C), resp. rate 16, SpO2 100 %. There is no height or weight on file to calculate BMI.  Demographic Factors:  Male  Loss Factors: Financial problems/change in socioeconomic status  Historical Factors: Family history of mental illness or substance abuse  Risk Reduction Factors:   Positive social support and Positive  therapeutic relationship  Continued Clinical Symptoms:  Depression:   Comorbid alcohol abuse/dependence  Cognitive Features That Contribute To Risk:  Closed-mindedness    Suicide Risk:  Mild:  Suicidal ideation of limited frequency, intensity, duration, and specificity.  There are no identifiable plans, no associated intent, mild dysphoria and related symptoms, good self-control (both objective and subjective assessment), few other risk factors, and identifiable protective factors, including available and accessible social support.  Plan Of Care/Follow-up recommendations:  Activity:  as tolerated Diet:  Heart healthy   Disposition: Take all medications as prescribed. Keep all follow-up appointments as scheduled.  Do not consume alcohol or use illegal drugs while on prescription medications. Report any adverse effects from your medications to your primary care provider promptly.  In the event of recurrent symptoms or worsening symptoms, call 911, a crisis hotline, or go to the nearest emergency department for evaluation.    Oneta Rack, NP 07/23/2021, 11:27 AM

## 2021-07-24 ENCOUNTER — Encounter (HOSPITAL_COMMUNITY): Payer: Self-pay

## 2021-07-24 DIAGNOSIS — Z8673 Personal history of transient ischemic attack (TIA), and cerebral infarction without residual deficits: Secondary | ICD-10-CM | POA: Insufficient documentation

## 2021-07-24 DIAGNOSIS — J302 Other seasonal allergic rhinitis: Secondary | ICD-10-CM | POA: Insufficient documentation

## 2021-07-24 DIAGNOSIS — E785 Hyperlipidemia, unspecified: Secondary | ICD-10-CM | POA: Insufficient documentation

## 2021-07-24 DIAGNOSIS — F419 Anxiety disorder, unspecified: Secondary | ICD-10-CM | POA: Diagnosis present

## 2021-07-24 DIAGNOSIS — I499 Cardiac arrhythmia, unspecified: Secondary | ICD-10-CM | POA: Insufficient documentation

## 2021-07-24 DIAGNOSIS — K7689 Other specified diseases of liver: Secondary | ICD-10-CM | POA: Insufficient documentation

## 2021-07-24 DIAGNOSIS — I509 Heart failure, unspecified: Secondary | ICD-10-CM | POA: Insufficient documentation

## 2021-07-24 DIAGNOSIS — N281 Cyst of kidney, acquired: Secondary | ICD-10-CM | POA: Insufficient documentation

## 2021-07-24 DIAGNOSIS — F333 Major depressive disorder, recurrent, severe with psychotic symptoms: Secondary | ICD-10-CM

## 2021-07-24 DIAGNOSIS — T8859XA Other complications of anesthesia, initial encounter: Secondary | ICD-10-CM | POA: Insufficient documentation

## 2021-07-24 LAB — GC/CHLAMYDIA PROBE AMP (~~LOC~~) NOT AT ARMC
Chlamydia: NEGATIVE
Comment: NEGATIVE
Comment: NORMAL
Neisseria Gonorrhea: NEGATIVE

## 2021-07-24 MED ORDER — LISINOPRIL 20 MG PO TABS
20.0000 mg | ORAL_TABLET | Freq: Every day | ORAL | Status: DC
  Filled 2021-07-24 (×2): qty 1

## 2021-07-24 MED ORDER — ARIPIPRAZOLE 5 MG PO TABS
5.0000 mg | ORAL_TABLET | Freq: Every day | ORAL | Status: DC
  Administered 2021-07-25: 5 mg via ORAL
  Filled 2021-07-24 (×3): qty 1

## 2021-07-24 MED ORDER — METOPROLOL TARTRATE 50 MG PO TABS
100.0000 mg | ORAL_TABLET | Freq: Two times a day (BID) | ORAL | Status: DC
  Administered 2021-07-24: 100 mg via ORAL
  Filled 2021-07-24: qty 1
  Filled 2021-07-24 (×2): qty 2
  Filled 2021-07-24: qty 1
  Filled 2021-07-24: qty 2

## 2021-07-24 MED ORDER — AMLODIPINE BESYLATE 10 MG PO TABS
10.0000 mg | ORAL_TABLET | Freq: Every day | ORAL | Status: DC
  Filled 2021-07-24 (×2): qty 1

## 2021-07-24 NOTE — Progress Notes (Signed)
Pt denies SI/HI/AVH and verbally agrees to approach staff if these become apparent or before harming themselves/others. Rates depression 10/10. Rates anxiety 5/10. Rates pain 0/10. Scheduled medications administered to pt, per MD orders. RN provided support and encouragement to pt. Q15 min safety checks implemented and continued. Pt safe on the unit. RN will continue to monitor and intervene as needed.   07/24/21 0801  Psych Admission Type (Psych Patients Only)  Admission Status Voluntary  Psychosocial Assessment  Patient Complaints Anxiety;Depression  Eye Contact Brief  Facial Expression Flat  Affect Depressed;Sad  Speech Logical/coherent;Soft  Interaction Forwards little;Minimal  Motor Activity Other (Comment) (WDL)  Appearance/Hygiene Unremarkable  Behavior Characteristics Appropriate to situation;Cooperative;Anxious  Mood Depressed;Anxious;Sad  Thought Process  Coherency WDL  Content WDL  Delusions None reported or observed  Perception WDL  Hallucination None reported or observed  Judgment Impaired  Confusion None  Danger to Self  Current suicidal ideation? Denies  Self-Injurious Behavior No self-injurious ideation or behavior indicators observed or expressed   Agreement Not to Harm Self Yes  Description of Agreement Verbal  Danger to Others  Danger to Others None reported or observed

## 2021-07-24 NOTE — Group Note (Signed)
[facility] LCSW Group Therapy Note   Group Date: 07/24/2021 Start Time: 1300 End Time: 1400   Type of Therapy/Topic:  Group Therapy:  Emotion Regulation  Participation Level:  Active   Mood:  Description of Group:    The purpose of this group is to assist patients in learning to regulate negative emotions and experience positive emotions. Patients will be guided to discuss ways in which they have been vulnerable to their negative emotions. These vulnerabilities will be juxtaposed with experiences of positive emotions or situations, and patients challenged to use positive emotions to combat negative ones. Special emphasis will be placed on coping with negative emotions in conflict situations, and patients will process healthy conflict resolution skills.  Therapeutic Goals: Patient will identify two positive emotions or experiences to reflect on in order to balance out negative emotions:  Patient will label two or more emotions that they find the most difficult to experience:  Patient will be able to demonstrate positive conflict resolution skills through discussion or role plays:   Summary of Patient Progress:   Pt shared during introductions his name and that today they are feeling incomplete. Pt was appropriate and attentive during group discussion.      Therapeutic Modalities:   Cognitive Behavioral Therapy Feelings Identification Dialectical Behavioral Therapy   Felizardo Hoffmann, LCSWA

## 2021-07-24 NOTE — [facility] Group Notes (Signed)
Spiritual care group on grief and loss facilitated by chaplains Amy Burris, Mdiv and Dyanne Carrel, [facility]   Group Goal:   Support / Education around grief and loss   Members engage in facilitated group support and psycho-social education.   Group Description:   Following introductions and group rules, group members engaged in facilitated group dialog and support around topic of loss, with particular support around experiences of loss in their lives. Group Identified types of loss (relationships / self / things) and identified patterns, circumstances, and changes that precipitate losses. Reflected on thoughts / feelings around loss, normalized grief responses, and recognized variety in grief experience. Group noted Worden's four tasks of grief in discussion.   Group drew on Adlerian / Rogerian, narrative, MI,   Patient Progress: Did not attend.
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
## 2021-07-24 NOTE — BH IP Treatment Plan (Signed)
Interdisciplinary Treatment and Diagnostic Plan Update  07/24/2021 Time of Session: 9:00am Cole Cannon MRN: [id number]  Principal Diagnosis: MDD (major depressive disorder)  Secondary Diagnoses: Principal Problem:   MDD (major depressive disorder)   Current Medications:  Current Facility-Administered Medications  Medication Dose Route Frequency Provider Last Rate Last Admin   acetaminophen (TYLENOL) tablet 650 mg  650 mg Oral Q6H PRN Derrill Center, NP   650 mg at 07/23/21 1550   alum & mag hydroxide-simeth (MAALOX/MYLANTA) 200-200-20 MG/5ML suspension 30 mL  30 mL Oral Q4H PRN Derrill Center, NP       carbamazepine (TEGRETOL XR) 12 hr tablet 300 mg  300 mg Oral BID Derrill Center, NP   300 mg at 07/24/21 0801   citalopram (CELEXA) tablet 10 mg  10 mg Oral Daily Derrill Center, NP   10 mg at 07/24/21 0801   hydrOXYzine (ATARAX) tablet 25 mg  25 mg Oral Q6H PRN Derrill Center, NP   25 mg at 07/24/21 0801   loperamide (IMODIUM) capsule 2-4 mg  2-4 mg Oral PRN Derrill Center, NP       LORazepam (ATIVAN) tablet 1 mg  1 mg Oral Q6H PRN Derrill Center, NP       magnesium hydroxide (MILK OF MAGNESIA) suspension 30 mL  30 mL Oral Daily PRN Derrill Center, NP       multivitamin with minerals tablet 1 tablet  1 tablet Oral Daily Derrill Center, NP   1 tablet at 07/24/21 0801   ondansetron (ZOFRAN-ODT) disintegrating tablet 4 mg  4 mg Oral Q6H PRN Derrill Center, NP       thiamine tablet 100 mg  100 mg Oral Daily Derrill Center, NP   100 mg at 07/24/21 0801   traZODone (DESYREL) tablet 50 mg  50 mg Oral QHS PRN Derrill Center, NP   50 mg at 07/23/21 2148   PTA Medications: Medications Prior to Admission  Medication Sig Dispense Refill Last Dose   amLODipine (NORVASC) 10 MG tablet Take 1 tablet (10 mg total) by mouth daily. 30 tablet 2    carbamazepine (TEGRETOL) 200 MG tablet TAKE ONE TABLET BY MOUTH TWICE DAILY @ 9AM & 5PM 60 tablet 0    citalopram (CELEXA) 20 MG tablet TAKE ONE  TABLET BY MOUTH DAILY AT 9AM 30 tablet 0    lisinopril (ZESTRIL) 20 MG tablet TAKE ONE TABLET BY MOUTH DAILY AT 9AM 30 tablet 1    metoprolol tartrate (LOPRESSOR) 100 MG tablet TAKE ONE TABLET BY MOUTH TWICE DAILY @ 9AM & 5PM WITH FOOD 60 tablet 0     Patient Stressors: Financial difficulties   Marital or family conflict   Substance abuse    Patient Strengths: Ability for insight  Average or above average intelligence  Capable of independent living  Hydrographic surveyor for treatment/growth  Supportive family/friends   Treatment Modalities: Medication Management, Group therapy, Case management,  1 to 1 session with clinician, Psychoeducation, Recreational therapy.   Physician Treatment Plan for Primary Diagnosis: MDD (major depressive disorder) Long Term Goal(s):     Short Term Goals:    Medication Management: Evaluate patient's response, side effects, and tolerance of medication regimen.  Therapeutic Interventions: 1 to 1 sessions, Unit Group sessions and Medication administration.  Evaluation of Outcomes: Not Met  Physician Treatment Plan for Secondary Diagnosis: Principal Problem:   MDD (major depressive disorder)  Long Term Goal(s):     Short Term  Goals:       Medication Management: Evaluate patient's response, side effects, and tolerance of medication regimen.  Therapeutic Interventions: 1 to 1 sessions, Unit Group sessions and Medication administration.  Evaluation of Outcomes: Not Met   RN Treatment Plan for Primary Diagnosis: MDD (major depressive disorder) Long Term Goal(s): Knowledge of disease and therapeutic regimen to maintain health will improve  Short Term Goals: Ability to remain free from injury will improve, Ability to verbalize frustration and anger appropriately will improve, Ability to demonstrate self-control, Ability to participate in decision making will improve, Ability to verbalize feelings will improve, Ability to disclose and  discuss suicidal ideas, and Compliance with prescribed medications will improve  Medication Management: RN will administer medications as ordered by provider, will assess and evaluate patient's response and provide education to patient for prescribed medication. RN will report any adverse and/or side effects to prescribing provider.  Therapeutic Interventions: 1 on 1 counseling sessions, Psychoeducation, Medication administration, Evaluate responses to treatment, Monitor vital signs and CBGs as ordered, Perform/monitor CIWA, COWS, AIMS and Fall Risk screenings as ordered, Perform wound care treatments as ordered.  Evaluation of Outcomes: Not Met   LCSW Treatment Plan for Primary Diagnosis: MDD (major depressive disorder) Long Term Goal(s): Safe transition to appropriate next level of care at discharge, Engage patient in therapeutic group addressing interpersonal concerns.  Short Term Goals: Engage patient in aftercare planning with referrals and resources, Increase social support, Increase ability to appropriately verbalize feelings, Facilitate acceptance of mental health diagnosis and concerns, Facilitate patient progression through stages of change regarding substance use diagnoses and concerns, Identify triggers associated with mental health/substance abuse issues, and Increase skills for wellness and recovery  Therapeutic Interventions: Assess for all discharge needs, 1 to 1 time with Social worker, Explore available resources and support systems, Assess for adequacy in community support network, Educate family and significant other(s) on suicide prevention, Complete Psychosocial Assessment, Interpersonal group therapy.  Evaluation of Outcomes: Not Met   Progress in Treatment: Attending groups: No. Participating in groups: No. Taking medication as prescribed: Yes. Toleration medication: Yes. Family/Significant other contact made: No, will contact:  if consent is provided Patient  understands diagnosis: Yes. and No. Discussing patient identified problems/goals with staff: Yes. Medical problems stabilized or resolved: Yes. Denies suicidal/homicidal ideation: Yes. Issues/concerns per patient self-inventory: No.   New problem(s) identified: No, Describe:  none  New Short Term/Long Term Goal(s): detox, medication management for mood stabilization; elimination of SI thoughts; development of comprehensive mental wellness/sobriety plan   Patient Goals:  "Stabilization"   Discharge Plan or Barriers: Patient recently admitted. CSW will continue to follow and assess for appropriate referrals and possible discharge planning.    Reason for Continuation of Hospitalization: Depression Medication stabilization Suicidal ideation Withdrawal symptoms  Estimated Length of Stay: 3-5 days   Scribe for Treatment Team: Vassie Moselle, LCSW 07/24/2021 10:16 AM

## 2021-07-24 NOTE — Group Note (Signed)
Recreation Therapy Group Note   Group Topic:Stress Management  Group Date: 07/24/2021 Start Time: 0945 End Time: 1000 Facilitators: Caroll Rancher, [place] Location: [address] Dayroom   Goal Area(s) Addresses:  Patient will identify positive stress management techniques. Patient will identify benefits of using stress management post d/c.  Group Description:  Meditation.  LRT played a meditation that focused on using positive affirmations.  Patients were to listen, follow and repeat the affirmations to themselves as the meditation played.  At completion of meditation, LRT discussed with patients were they could find other stress management techniques like Youtube and Apps.    Affect/Mood: N/A   Participation Level: Did not attend    Clinical Observations/Individualized Feedback:     Plan: Continue to engage patient in RT group sessions 2-3x/week.   Caroll Rancher, LRT,CTRS 07/24/2021 1:10 PM

## 2021-07-24 NOTE — Progress Notes (Signed)
The patient attended the evening A.A.meeting and was appropriate.  

## 2021-07-25 LAB — CBC
HCT: 43.8 % (ref 39.0–52.0)
Hemoglobin: 14.5 g/dL (ref 13.0–17.0)
MCH: 31 pg (ref 26.0–34.0)
MCHC: 33.1 g/dL (ref 30.0–36.0)
MCV: 93.6 fL (ref 80.0–100.0)
Platelets: 314 10*3/uL (ref 150–400)
RBC: 4.68 MIL/uL (ref 4.22–5.81)
RDW: 12.4 % (ref 11.5–15.5)
WBC: 9.3 10*3/uL (ref 4.0–10.5)
nRBC: 0 % (ref 0.0–0.2)

## 2021-07-25 LAB — COMPREHENSIVE METABOLIC PANEL
ALT: 16 U/L (ref 0–44)
AST: 19 U/L (ref 15–41)
Albumin: 3.9 g/dL (ref 3.5–5.0)
Alkaline Phosphatase: 79 U/L (ref 38–126)
Anion gap: 9 (ref 5–15)
BUN: 16 mg/dL (ref 6–20)
CO2: 22 mmol/L (ref 22–32)
Calcium: 9.2 mg/dL (ref 8.9–10.3)
Chloride: 101 mmol/L (ref 98–111)
Creatinine, Ser: 1.17 mg/dL (ref 0.61–1.24)
GFR, Estimated: >60 mL/min (ref >60)
Glucose, Bld: 154 mg/dL — ABNORMAL HIGH (ref 70–99)
Potassium: 4.2 mmol/L (ref 3.5–5.1)
Sodium: 132 mmol/L — ABNORMAL LOW (ref 135–145)
Total Bilirubin: 0.4 mg/dL (ref 0.3–1.2)
Total Protein: 7.2 g/dL (ref 6.5–8.1)

## 2021-07-25 LAB — TSH: TSH: 2.466 u[IU]/mL (ref 0.350–4.500)

## 2021-07-25 LAB — HEMOGLOBIN A1C
Hgb A1c MFr Bld: 5.8 % — ABNORMAL HIGH (ref 4.8–5.6)
Mean Plasma Glucose: 119.76 mg/dL

## 2021-07-25 MED ORDER — LISINOPRIL 10 MG PO TABS
10.0000 mg | ORAL_TABLET | Freq: Every day | ORAL | Status: DC
  Administered 2021-07-25 – 2021-07-29 (×5): 10 mg via ORAL
  Filled 2021-07-25 (×5): qty 1
  Filled 2021-07-25: qty 14
  Filled 2021-07-25: qty 1
  Filled 2021-07-25: qty 14

## 2021-07-25 MED ORDER — TRAZODONE HCL 100 MG PO TABS
100.0000 mg | ORAL_TABLET | Freq: Every day | ORAL | Status: DC
  Administered 2021-07-25 – 2021-07-26 (×2): 100 mg via ORAL
  Filled 2021-07-25 (×6): qty 1

## 2021-07-25 MED ORDER — METOPROLOL TARTRATE 50 MG PO TABS
50.0000 mg | ORAL_TABLET | Freq: Two times a day (BID) | ORAL | Status: DC
  Administered 2021-07-25 – 2021-07-29 (×9): 50 mg via ORAL
  Filled 2021-07-25 (×2): qty 1
  Filled 2021-07-25: qty 28
  Filled 2021-07-25 (×4): qty 1
  Filled 2021-07-25: qty 28
  Filled 2021-07-25 (×4): qty 1
  Filled 2021-07-25: qty 28
  Filled 2021-07-25: qty 1
  Filled 2021-07-25: qty 28

## 2021-07-25 MED ORDER — TRAZODONE HCL 50 MG PO TABS
50.0000 mg | ORAL_TABLET | Freq: Every evening | ORAL | Status: DC | PRN
  Administered 2021-07-27: 50 mg via ORAL
  Filled 2021-07-25: qty 1

## 2021-07-25 MED ORDER — MENTHOL 3 MG MT LOZG: 1.0000 | LOZENGE | OROMUCOSAL | Status: DC | PRN

## 2021-07-25 MED ORDER — ARIPIPRAZOLE 10 MG PO TABS
10.0000 mg | ORAL_TABLET | Freq: Every day | ORAL | Status: DC
  Administered 2021-07-26: 10 mg via ORAL
  Filled 2021-07-25 (×3): qty 1

## 2021-07-25 NOTE — Group Note (Signed)
Date:  07/25/2021 Time:  10:17 AM  Group Topic/Focus:  Orientation:   The focus of this group is to educate the patient on the purpose and policies of crisis stabilization and provide a format to answer questions about their admission.  The group details unit policies and expectations of patients while admitted.    Participation Level:  Minimal  Participation Quality:  Appropriate  Affect:  Blunted  Cognitive:  Appropriate  Insight: Appropriate  Engagement in Group:  Limited  Modes of Intervention:  Discussion  Additional Comments:    Reymundo Poll 07/25/2021, 10:17 AM

## 2021-07-25 NOTE — BHH Suicide Risk Assessment (Signed)
B[facility] INPATIENT:  Family/Significant Other Suicide Prevention Education  Suicide Prevention Education:  Education Completed; Cole Cannon (daughter) [phone],  (name of family member/significant other) has been identified by the patient as the family member/significant other with whom the patient will be residing, and identified as the person(s) who will aid the patient in the event of a mental health crisis (suicidal ideations/suicide attempt).  With written consent from the patient, the family member/significant other has been provided the following suicide prevention education, prior to the and/or following the discharge of the patient.  The suicide prevention education provided includes the following: Suicide risk factors Suicide prevention and interventions National Suicide Hotline telephone number [facility] assessment telephone number [facility] Emergency Assistance 911 [facility] and/or Residential Mobile Crisis Unit telephone number  Request made of family/significant other to: Remove weapons (e.g., guns, rifles, knives), all items previously/currently identified as safety concern.   Remove drugs/medications (over-the-counter, prescriptions, illicit drugs), all items previously/currently identified as a safety concern.  The family member/significant other verbalizes understanding of the suicide prevention education information provided.  The family member/significant other agrees to remove the items of safety concern listed above.  "He is going through a divorce. He was in [place] and I picked him up. He had done powder and has been drinking alcohol. Then he had thoughts of cutting himself. When I picked him up I was unsure of how long he had been using. He gets disappointed easily.".  CSW discussed the referrals being made to substance use treatment facilities per pt request.   No safety concerns. No access to weapons.   Cole Cannon 07/25/2021, 3:36  PM

## 2021-07-25 NOTE — BHH Counselor (Signed)
Patient called [facility] and they requested a med list for patient.  CSW provided patient with med list so that he can continue to participate in screener.    Jermia Rigsby, LCSW, LCAS Clincal Social Worker  [facility]

## 2021-07-25 NOTE — Progress Notes (Signed)
If you don't have all my medicines at one time, don't call me.  Get a job at [employer].

## 2021-07-25 NOTE — BHH Group Notes (Signed)
Patient did not attend the Wrap-up group. 

## 2021-07-25 NOTE — Progress Notes (Signed)
[facility] MD Progress Note  07/25/2021 4:18 PM Cole Cannon  MRN:  [id number] Subjective:   Principal Problem: Major depressive disorder Diagnosis: Principal Problem:   Major depressive disorder Active Problems:   Anxiety   Bipolar disorder (HCC)   Schizophrenia (HCC)   Substance abuse ([place])   Tobacco abuse  Cole Cannon is a 61 year old male with a self-reported past psychiatric history of schizoaffective disorder and bipolar affective disorder.  He came to the behavioral health urgent care as a walk-in with his daughter Cole Cannon complaining of suicidal thoughts without plan or intention, as well as substance abuse issues with multiple drugs.  He is admitted to the behavioral health hospital on a voluntary basis.  The patient's chart was reviewed. Over the past 24 hrs, there were no documented behavioral issues, no PRN medications given for agitation, and the patient was compliant with scheduled medications. The patient's case was discussed in multidisciplinary team meeting.   On interview and assessment this morning, the patient appears improved to yesterday.  He is less emotionally labile and is able to present some linear and logical thought.  When asked what brought him in the hospital, he reports "I lost myself" and "I called my daughters and they brought me here".  He continues to report that his "wife got me fired".  He reports last hearing auditory hallucinations yesterday and denies any recent visual hallucinations.  At this point in the interview, the patient becomes tearful again and has difficulty communicating.  He is able to relate that he does not have any suicidal thoughts, medication side effects, and that his mood is stable.  He reports sleeping poorly last night.  He is amenable to trazodone and an increase in his Abilify.  Past Psychiatric Hx: Previous Psych Diagnoses: Self-reported diagnoses of schizoaffective disorder and bipolar affective disorder Prev unsuccessful med  trials: Patient is unable to name his current meds or previous medications   Prior inpatient treatment: Patient reports 10-15 previous psychiatric hospitalizations, no records found in the electronic medical record.     Suicide Risk Questions: History of suicide attempts: Patient reports 1 previous overdose on medications as well as 1 previous episode of slitting his wrists in the hope to end his life Hx of self harm: Denies any aside from the above episode Kids: All children above the age of 24 years old Religious beliefs: Confirms religious beliefs that people commit suicide go to hell FMHx of suicide attempts: Denies Access to lethal means: Denies History of homicide: Denies   Substance Abuse Hx: Reports frequently binge drinking, denies history of alcohol withdrawal, including not having any seizures previously.  Reports heavy marijuana and cocaine use recently   Past Medical History: Medical Diagnoses: Reports having a previous heart attack and stent placed   Family History: Psych: Reports having a sister with schizophrenia and a father with alcohol use disorder     Social History: 61 year old male currently on disability for reasons of mental illness, currently dealing with homelessness and addiction, reports interest in going to rehab.   Sleep: Fair  Appetite:  Fair  Current Medications: Current Facility-Administered Medications  Medication Dose Route Frequency Provider Last Rate Last Admin   acetaminophen (TYLENOL) tablet 650 mg  650 mg Oral Q6H PRN Derrill Center, NP   650 mg at 07/23/21 1550   alum & mag hydroxide-simeth (MAALOX/MYLANTA) 200-200-20 MG/5ML suspension 30 mL  30 mL Oral Q4H PRN Derrill Center, NP       [START ON 07/26/2021] ARIPiprazole (ABILIFY)  tablet 10 mg  10 mg Oral Daily Massengill, Nathan, MD       hydrOXYzine (ATARAX) tablet 25 mg  25 mg Oral Q6H PRN Oneta Rack, NP   25 mg at 07/25/21 0902   lisinopril (ZESTRIL) tablet 10 mg  10 mg Oral  Daily Carlyn Reichert, MD   10 mg at 07/25/21 0944   loperamide (IMODIUM) capsule 2-4 mg  2-4 mg Oral PRN Oneta Rack, NP       LORazepam (ATIVAN) tablet 1 mg  1 mg Oral Q6H PRN Oneta Rack, NP       magnesium hydroxide (MILK OF MAGNESIA) suspension 30 mL  30 mL Oral Daily PRN Oneta Rack, NP       menthol-cetylpyridinium (CEPACOL) lozenge 3 mg  1 lozenge Oral PRN Ladona Ridgel, Cody W, PA-C       metoprolol tartrate (LOPRESSOR) tablet 50 mg  50 mg Oral BID Carlyn Reichert, MD   50 mg at 07/25/21 0944   multivitamin with minerals tablet 1 tablet  1 tablet Oral Daily Oneta Rack, NP   1 tablet at 07/25/21 0830   ondansetron (ZOFRAN-ODT) disintegrating tablet 4 mg  4 mg Oral Q6H PRN Oneta Rack, NP       thiamine tablet 100 mg  100 mg Oral Daily Oneta Rack, NP   100 mg at 07/25/21 0900   traZODone (DESYREL) tablet 100 mg  100 mg Oral QHS Massengill, Harrold Donath, MD       traZODone (DESYREL) tablet 50 mg  50 mg Oral QHS PRN Phineas Inches, MD       Psychiatric Specialty Exam: Physical Exam Vitals reviewed.  Pulmonary:     Effort: Pulmonary effort is normal.  Neurological:     General: No focal deficit present.     Mental Status: He is oriented to person, place, and time.     Review of Systems  Respiratory:  Negative for shortness of breath.   Cardiovascular:  Negative for chest pain.  Gastrointestinal:  Negative for diarrhea, nausea and vomiting.  Neurological:  Negative for dizziness and headaches.   BP (!) 120/100 (BP Location: Left Arm)    Pulse (!) 58    Temp 97.9 F (36.6 C) (Oral)    Resp 16    Ht 5\' 10"  (1.778 m)    Wt 87.1 kg    SpO2 100%    BMI 27.55 kg/m    General Appearance: casually dressed older male  Eye Contact:  Fair  Speech:  Garbled  Volume:  Decreased  Mood:  "stable"  Affect:  more calm, less labile and tearful  Thought Process:  Disorganized  Orientation:  Full (Time, Place, and Person)  Thought Content:  Denies SI (first time 2/14), denies AH  and VH. Patient is not grossly responding to internal/external stimuli on exam and did not make delusional statements.    Suicidal Thoughts:  none  Homicidal Thoughts:  No  Memory:  poor  Judgement:  poor  Insight:  poor  Psychomotor Activity:  normal  Concentration:  poor  Recall:  poor  Fund of Knowledge:  poor  Language:  Poor        AIMS (if indicated):   not yet assessed  Assets:  Financial Resources/Insurance  ADL's:  Intact     Sleep:  fair     Lab Results: No results found for this or any previous visit (from the past 48 hour(s)).  Blood Alcohol level:  No results found  for: [facility]  Metabolic Disorder Labs: Lab Results  Component Value Date   HGBA1C 5.7 (A) 03/29/2021   No results found for: PROLACTIN Lab Results  Component Value Date   CHOL 152 07/22/2021   TRIG 94 07/22/2021   HDL 32 (L) 07/22/2021   CHOLHDL 4.8 07/22/2021   VLDL 19 07/22/2021   LDLCALC 101 (H) 07/22/2021   LDLCALC 126 (H) 03/29/2021   Treatment Plan Summary: Daily contact with patient to assess and evaluate symptoms and progress in treatment and Medication management  Safety and Monitoring -- VOLUNTARY admission to inpatient psychiatric unit for safety, stabilization and treatment -- Daily contact with patient to assess and evaluate symptoms and progress in treatment -- Patient's case to be discussed in multi-disciplinary team meeting -- Observation Level : q15 minute checks -- Vital signs:  q12 hours -- Precautions: suicide   Diagnoses: Schizoaffective disorder depressed type vs MDD with psychotic features vs substance induced mood disorder Cocaine use disorder Marijuana use disorder   #Schizoaffective disorder depressed type vs MDD with psychotic features vs substance induced mood disorder -Discontinued Tegretol and Celexa given lack of efficacy -Increase Abilify to 10 mg QHS for depression and psychosis             -r/b/se of medication discussed and patient consents to med  trial -Consider addition of Zoloft in subsequent days and monitor for affective switch give questionable bipolar Hx.  -Trazodone 100 mg QHS, with 50 mg PRN for insomnia   #Cocaine use disorder #Marijuana use disorder -Encourage abstinence  -Supportive psychotherapy  Medical Management Covid negative CMP: cr of 1.36  -Recheck CBC: WBCs 11.8  -Recheck EtOH: <10 UDS: amphetamines, cocaine, meth, MJ TSH: pending A1C: pending Lipids: 10 AM, LDL 101, HDL 32 EKG: NSR, Qtc of 417  #Hypertension Home regimen of Amlodipine 10 mg, Lisinopril 20 mg, and Lopressor 100 mg BID. Patient is non-compliant.  -Continue Lisinopril at 10 mg -Continue Lopressor at 50 mg BID   Dispo: hopefully to [facility] or similar sober living   Corky Sox, MD 07/25/2021, 4:18 PM

## 2021-07-25 NOTE — Progress Notes (Signed)
D:  Patient denied SI and HI, contracts for safety  Denied A/V hallucinations.   A:  Medications administered per MD orders.  Emotional support and encouragement given patient. R:  Safety maintained with 15 minute checks.  Patient's attitude has change after lunch.  He is in a better mood, smiling, and pleasant to staff and peers.

## 2021-07-25 NOTE — Progress Notes (Signed)
°   07/25/21 2121  Psych Admission Type (Psych Patients Only)  Admission Status Voluntary  Psychosocial Assessment  Patient Complaints Anxiety;Depression  Eye Contact Brief;Fair  Facial Expression Anxious  Affect Appropriate to circumstance  Speech Logical/coherent  Interaction Assertive  Motor Activity Other (Comment) (WDL)  Appearance/Hygiene Unremarkable  Behavior Characteristics Cooperative;Appropriate to situation  Mood Depressed;Pleasant  Thought Process  Coherency WDL  Content WDL  Delusions None reported or observed  Perception WDL  Hallucination None reported or observed  Judgment Impaired  Confusion None  Danger to Self  Current suicidal ideation? Denies  Self-Injurious Behavior No self-injurious ideation or behavior indicators observed or expressed   Agreement Not to Harm Self No  Description of Agreement Verbal contract  Danger to Others  Danger to Others None reported or observed

## 2021-07-25 NOTE — Progress Notes (Signed)
°   07/25/21 0047  Psych Admission Type (Psych Patients Only)  Admission Status Voluntary  Psychosocial Assessment  Patient Complaints Depression;Anxiety  Eye Contact Brief  Facial Expression Anxious  Affect Depressed  Speech Logical/coherent  Interaction Assertive  Motor Activity Other (Comment) (WDL)  Appearance/Hygiene Unremarkable  Behavior Characteristics Cooperative;Appropriate to situation  Mood Depressed  Thought Process  Coherency WDL  Content WDL  Delusions None reported or observed  Perception WDL  Hallucination None reported or observed  Judgment Impaired  Confusion None  Danger to Self  Current suicidal ideation? Denies  Self-Injurious Behavior No self-injurious ideation or behavior indicators observed or expressed   Agreement Not to Harm Self No  Danger to Others  Danger to Others None reported or observed   D: On evening VS check pt was hypertensive and asked about taking his blood pressure medication. There was no order and pt did not know name or dosage of what he takes at home. PA  notified and new orders given.  A: Medications administered as prescribed. Support and education provided as needed.  R: Patient remains safe on the unit. Will continue to monitor for safety and stability.

## 2021-07-25 NOTE — Plan of Care (Signed)
Nurse discussed anxiety, depression and coping skills with patient.  

## 2021-07-25 NOTE — BHH Counselor (Signed)
CSW gave pt information for [facility] and [employer].   Toney Reil, [place] Worker [employer]

## 2021-07-26 ENCOUNTER — Other Ambulatory Visit: Payer: Self-pay | Admitting: Nurse Practitioner

## 2021-07-26 DIAGNOSIS — Z760 Encounter for issue of repeat prescription: Secondary | ICD-10-CM

## 2021-07-26 DIAGNOSIS — I10 Essential (primary) hypertension: Secondary | ICD-10-CM

## 2021-07-26 MED ORDER — ARIPIPRAZOLE 15 MG PO TABS
15.0000 mg | ORAL_TABLET | Freq: Every day | ORAL | Status: DC
  Administered 2021-07-27 – 2021-07-29 (×3): 15 mg via ORAL
  Filled 2021-07-26 (×2): qty 14
  Filled 2021-07-26 (×4): qty 1

## 2021-07-26 NOTE — Progress Notes (Signed)
D:  Patient denied SI and HI, contracts for safety.  Denied A/V hallucinations.  Denied pain. A:  Medications administered per MD orders.  Emotional support and encouragement given patient. R:  Safety maintained with 15 minute checks.  

## 2021-07-26 NOTE — Group Note (Signed)
Date:  07/26/2021 Time:  11:12 AM  Group Topic/Focus:  Personal Choices and Values:   The focus of this group is to help patients assess and explore the importance of values in their lives, how their values affect their decisions, how they express their values and what opposes their expression.    Participation Level:  Minimal  Participation Quality:  Appropriate  Affect:  Appropriate  Cognitive:  Appropriate  Insight: Appropriate  Engagement in Group:  Lacking  Modes of Intervention:  Discussion  Additional Comments:    Jaquita Rector 07/26/2021, 11:12 AM

## 2021-07-26 NOTE — BHH Counselor (Addendum)
CSW called and left message for [facility] x2 concerning pt's admission status.  CSW contacted [employer] concerning pt's admission status. They stated once fax had been viewed CSW will receive a call.   Toney Reil, [place] Worker [employer]

## 2021-07-26 NOTE — BHH Group Notes (Signed)
Patient did not attend the relaxation group. 

## 2021-07-26 NOTE — Group Note (Signed)
LCSW Group Therapy Note ° °Group Date: 07/26/2021 °Start Time: 1300 °End Time: 1400 ° ° °Type of Therapy and Topic:  Group Therapy: Anger Cues and Responses ° °Participation Level:  Active ° ° °Description of Group:   °In this group, patients learned how to recognize the physical, cognitive, emotional, and behavioral responses they have to anger-provoking situations.  They identified a recent time they became angry and how they reacted.  They analyzed how their reaction was possibly beneficial and how it was possibly unhelpful.  The group discussed a variety of healthier coping skills that could help with such a situation in the future.  Focus was placed on how helpful it is to recognize the underlying emotions to our anger, because working on those can lead to a more permanent solution as well as our ability to focus on the important rather than the urgent. ° °Therapeutic Goals: °Patients will remember their last incident of anger and how they felt emotionally and physically, what their thoughts were at the time, and how they behaved. °Patients will identify how their behavior at that time worked for them, as well as how it worked against them. °Patients will explore possible new behaviors to use in future anger situations. °Patients will learn that anger itself is normal and cannot be eliminated, and that healthier reactions can assist with resolving conflict rather than worsening situations. ° °Summary of Patient Progress:   Pt was active during the group. Pt demonstrated insight into the subject matter, was respectful of peers, and participated throughout the entire session. °  ° °Therapeutic Modalities:   °Cognitive Behavioral Therapy ° ° ° °Cole Cannon M Cole Cannon, LCSWA °07/26/2021  2:30 PM   ° °

## 2021-07-26 NOTE — Group Note (Signed)
Date:  07/26/2021 Time:  9:45 AM  Group Topic/Focus:  Orientation:   The focus of this group is to educate the patient on the purpose and policies of crisis stabilization and provide a format to answer questions about their admission.  The group details unit policies and expectations of patients while admitted.    Participation Level:  Did Not Attend  Participation Quality:    Affect:    Cognitive:    Insight:   Engagement in Group:    Modes of Intervention:    Additional Comments:  Pt did not attend group.  Jaquita Rector 07/26/2021, 9:45 AM

## 2021-07-26 NOTE — Progress Notes (Addendum)
[facility] MD Progress Note  07/26/2021 2:24 PM Cole Cannon  MRN:  [id number] Subjective:   Cole Cannon is a 61 yr old male  who presented to [facility] on 2/10, with his daughter Cole Cannon, complaining of suicidal thoughts without plan or intention, as well as substance abuse issues with multiple drugs, admitted to [facility] on 2/13.  PPHx is significant for self-reported Schizoaffective Disorder and Bipolar Affective Disorder, Polysubstance abuse, 2 prior Suicide attempts (OD and cutting wrists),  and multiple hospitalizations.    Case was discussed in the multidisciplinary team. MAR was reviewed and patient was compliant with medications.  He required PRN Atarax yesterday.   Psychiatric Team made the following recommendations yesterday: -Stop Tegretol -Stop Celexa -Increase Abilify to 10 mg daily -Decrease Metoprolol 50 mg BID -Decrease Lisinopril to 10 mg daily    On interview today patient reports he is not doing good today.  He reports that he slept great last night considering the previous night he reported sleeping only 2 hours.  He also quarts that he is eating great.  When asked about SI he states that he wishes he was not here, however, he reports no plans and reports he would tell staff if he began to.  He reports no HI or AVH.  He reports no [place], Ideas of Reference, or other [facility] symptoms.  He reports no issues with his medications.  When asked why he was saying he isn't doing good he reports that he is having trouble thinking.  He reports issues crying a lot and getting into a sensitive side of himself.  He states he is having mood swings and also dealing with things in his past that are no longer being covered up with drugs.  This that he tried to do an intake interview yesterday but thinks it did not go well.  Discussed with him that we think it is in his best interest to not do anymore placement interviews until his thinking is more clear.  Discussed that we would continue to  monitor him and would let him know when we think he is ready to begin doing the initial interviews again.  Discussed we will increase his Abilify to assist in this.  He was agreeable to this.  He reports no other concerns at present.   Principal Problem: Major depressive disorder Diagnosis: Principal Problem:   Major depressive disorder Active Problems:   Anxiety   Bipolar disorder ([place])   Schizophrenia (HCC)   Substance abuse ([place])   Tobacco abuse  Total Time spent with patient:  I personally spent 30 minutes on the unit in direct patient care. The direct patient care time included face-to-face time with the patient, reviewing the patient's chart, communicating with other professionals, and coordinating care. Greater than 50% of this time was spent in counseling or coordinating care with the patient regarding goals of hospitalization, psycho-education, and discharge planning needs.   Past Psychiatric History: self-reported Schizoaffective Disorder and Bipolar Affective Disorder, Polysubstance abuse, 2 prior Suicide attempts (OD and cutting wrists),  and multiple hospitalizations.   Past Medical History:  Past Medical History:  Diagnosis Date   Asthma    Depression    Heart attack ([place])    Hypertension    Seizures ([place])    History reviewed. No pertinent surgical history. Family History:  Family History  Problem Relation Age of Onset   Cancer Sister    Cancer Brother    Family Psychiatric  History: Sister- Schizophrenia Father- EtOH abuse Social History:  Social History   Substance and Sexual Activity  Alcohol Use Yes   Alcohol/week: 3.0 standard drinks   Types: 3 Shots of liquor per week     Social History   Substance and Sexual Activity  Drug Use Yes   Types: Marijuana, Cocaine, Methamphetamines    Social History   Socioeconomic History   Marital status: Legally Separated    Spouse name: Not on file   Number of children: Not on file   Years of education: Not  on file   Highest education level: Not on file  Occupational History   Not on file  Tobacco Use   Smoking status: Every Day    Packs/day: 1.00    Types: Cigarettes   Smokeless tobacco: Never  Substance and Sexual Activity   Alcohol use: Yes    Alcohol/week: 3.0 standard drinks    Types: 3 Shots of liquor per week   Drug use: Yes    Types: Marijuana, Cocaine, Methamphetamines   Sexual activity: Not Currently  Other Topics Concern   Not on file  Social History Narrative   Not on file   Social Determinants of Health   Financial Resource Strain: Not on file  Food Insecurity: Not on file  Transportation Needs: Not on file  Physical Activity: Not on file  Stress: Not on file  Social Connections: Not on file   Additional Social History:                         Sleep: Good  Appetite:  Good  Current Medications: Current Facility-Administered Medications  Medication Dose Route Frequency Provider Last Rate Last Admin   acetaminophen (TYLENOL) tablet 650 mg  650 mg Oral Q6H PRN Derrill Center, NP   650 mg at 07/23/21 1550   alum & mag hydroxide-simeth (MAALOX/MYLANTA) 200-200-20 MG/5ML suspension 30 mL  30 mL Oral Q4H PRN Derrill Center, NP       ARIPiprazole (ABILIFY) tablet 10 mg  10 mg Oral Daily Massengill, Ovid Curd, MD   10 mg at 07/26/21 [id number]   hydrOXYzine (ATARAX) tablet 25 mg  25 mg Oral Q6H PRN Derrill Center, NP   25 mg at 07/25/21 2121   lisinopril (ZESTRIL) tablet 10 mg  10 mg Oral Daily Corky Sox, MD   10 mg at 07/26/21 0820   loperamide (IMODIUM) capsule 2-4 mg  2-4 mg Oral PRN Derrill Center, NP       LORazepam (ATIVAN) tablet 1 mg  1 mg Oral Q6H PRN Derrill Center, NP       magnesium hydroxide (MILK OF MAGNESIA) suspension 30 mL  30 mL Oral Daily PRN Derrill Center, NP       menthol-cetylpyridinium (CEPACOL) lozenge 3 mg  1 lozenge Oral PRN Lovena Le, Cody W, PA-C       metoprolol tartrate (LOPRESSOR) tablet 50 mg  50 mg Oral BID Corky Sox,  MD   50 mg at 07/26/21 0820   multivitamin with minerals tablet 1 tablet  1 tablet Oral Daily Derrill Center, NP   1 tablet at 07/26/21 0821   ondansetron (ZOFRAN-ODT) disintegrating tablet 4 mg  4 mg Oral Q6H PRN Derrill Center, NP       thiamine tablet 100 mg  100 mg Oral Daily Derrill Center, NP   100 mg at 07/26/21 [id number]   traZODone (DESYREL) tablet 100 mg  100 mg Oral QHS Janine Limbo, MD   100 mg  at 07/25/21 2121   traZODone (DESYREL) tablet 50 mg  50 mg Oral QHS PRN Massengill, Ovid Curd, MD        Lab Results:  Results for orders placed or performed during the hospital encounter of 07/23/21 (from the past 48 hour(s))  Comprehensive metabolic panel     Status: Abnormal   Collection Time: 07/25/21  6:28 PM  Result Value Ref Range   Sodium 132 (L) 135 - 145 mmol/L   Potassium 4.2 3.5 - 5.1 mmol/L   Chloride 101 98 - 111 mmol/L   CO2 22 22 - 32 mmol/L   Glucose, Bld 154 (H) 70 - 99 mg/dL    Comment: Glucose reference range applies only to samples taken after fasting for at least 8 hours.   BUN 16 6 - 20 mg/dL   Creatinine, Ser 1.17 0.61 - 1.24 mg/dL   Calcium 9.2 8.9 - 10.3 mg/dL   Total Protein 7.2 6.5 - 8.1 g/dL   Albumin 3.9 3.5 - 5.0 g/dL   AST 19 15 - 41 U/L   ALT 16 0 - 44 U/L   Alkaline Phosphatase 79 38 - 126 U/L   Total Bilirubin 0.4 0.3 - 1.2 mg/dL   GFR, Estimated >60 >60 mL/min    Comment: (NOTE) Calculated using the CKD-EPI Creatinine Equation (2021)    Anion gap 9 5 - 15    Comment: Performed at [facility], [place] [address], [place], [place] [address]  CBC     Status: None   Collection Time: 07/25/21  6:28 PM  Result Value Ref Range   WBC 9.3 4.0 - 10.5 K/uL   RBC 4.68 4.22 - 5.81 MIL/uL   Hemoglobin 14.5 13.0 - 17.0 g/dL   HCT 43.8 39.0 - 52.0 %   MCV 93.6 80.0 - 100.0 fL   MCH 31.0 26.0 - 34.0 pg   MCHC 33.1 30.0 - 36.0 g/dL   RDW 12.4 11.5 - 15.5 %   Platelets 314 150 - 400 K/uL   nRBC 0.0 0.0 - 0.2 %    Comment: Performed at  [facility], [place] [address], [place], [place] [address]  TSH     Status: None   Collection Time: 07/25/21  6:28 PM  Result Value Ref Range   TSH 2.466 0.350 - 4.500 uIU/mL    Comment: Performed by a 3rd Generation assay with a functional sensitivity of <=0.01 uIU/mL. Performed at [facility], [place] [address], [place], [place] [address]   Hemoglobin A1c     Status: Abnormal   Collection Time: 07/25/21  6:28 PM  Result Value Ref Range   Hgb A1c MFr Bld 5.8 (H) 4.8 - 5.6 %    Comment: (NOTE) Pre diabetes:          5.7%-6.4%  Diabetes:              >6.4%  Glycemic control for   <7.0% adults with diabetes    Mean Plasma Glucose 119.76 mg/dL    Comment: Performed at [facility] [address], [place], [place] [address]    Blood Alcohol level:  No results found for: [facility]  Metabolic Disorder Labs: Lab Results  Component Value Date   HGBA1C 5.8 (H) 07/25/2021   MPG 119.76 07/25/2021   No results found for: PROLACTIN Lab Results  Component Value Date   CHOL 152 07/22/2021   TRIG 94 07/22/2021   HDL 32 (L) 07/22/2021   CHOLHDL 4.8 07/22/2021  VLDL 19 07/22/2021   LDLCALC 101 (H) 07/22/2021   LDLCALC 126 (H) 03/29/2021    Physical Findings: AIMS: Facial and Oral Movements Muscles of Facial Expression: None, normal Lips and Perioral Area: None, normal Jaw: None, normal Tongue: None, normal,Extremity Movements Upper (arms, wrists, hands, fingers): None, normal Lower (legs, knees, ankles, toes): None, normal, Trunk Movements Neck, shoulders, hips: None, normal, Overall Severity Severity of abnormal movements (highest score from questions above): None, normal Incapacitation due to abnormal movements: None, normal Patient's awareness of abnormal movements (rate only patient's report): No Awareness, Dental Status Current problems with teeth and/or dentures?: No Does patient usually wear dentures?: No  CIWA:  CIWA-Ar Total:  1 COWS:     Musculoskeletal: Strength & Muscle Tone: within normal limits Gait & Station: normal Patient leans: N/A  Psychiatric Specialty Exam:  Presentation  General Appearance: Appropriate for Environment; Casual  Eye Contact:Poor  Speech:Clear and Coherent; Normal Rate  Speech Volume:Normal  Handedness:Right   Mood and Affect  Mood:Anxious; Dysphoric  Affect:Congruent; Constricted   Thought Process  Thought Processes:Disorganized  Descriptions of Associations:Intact  Orientation:Full (Time, Place and Person)  Thought Content:Scattered; Rumination  History of Schizophrenia/Schizoaffective disorder:Yes  Duration of Psychotic Symptoms:No data recorded Hallucinations:Hallucinations: None  Ideas of Reference:None  Suicidal Thoughts:Suicidal Thoughts: Yes, Passive SI Passive Intent and/or Plan: Without Intent; Without Plan (COntracts for safety on the unit)  Homicidal Thoughts:Homicidal Thoughts: No   Sensorium  Memory:Immediate Poor; Recent Poor  Judgment:Poor  Insight:Poor  Executive Functions  Concentration:Fair  Attention Span:Fair  Recall:Poor  Fund of Knowledge:Poor  Language:Fair   Psychomotor Activity  Psychomotor Activity:Psychomotor Activity: Normal  Assets  Assets:Communication Skills; Housing   Sleep  Sleep:Sleep: Good   Physical Exam: Physical Exam Vitals and nursing note reviewed.  Constitutional:      General: He is not in acute distress.    Appearance: Normal appearance. He is normal weight. He is not ill-appearing or toxic-appearing.  HENT:     Head: Normocephalic and atraumatic.  Pulmonary:     Effort: Pulmonary effort is normal.  Musculoskeletal:        General: Normal range of motion.  Neurological:     General: No focal deficit present.     Mental Status: He is alert.   Review of Systems  Respiratory:  Negative for cough and shortness of breath.   Cardiovascular:  Negative for chest pain.   Gastrointestinal:  Negative for abdominal pain, constipation, diarrhea, nausea and vomiting.  Neurological:  Negative for dizziness, weakness and headaches.  Psychiatric/Behavioral:  Negative for depression, hallucinations and suicidal ideas. The patient is not nervous/anxious.   Blood pressure 130/90, pulse 88, temperature 97.8 F (36.6 C), resp. rate 17, height 5\' 10"  (1.778 m), weight 87.1 kg, SpO2 100 %. Body mass index is 27.55 kg/m.   Treatment Plan Summary: Daily contact with patient to assess and evaluate symptoms and progress in treatment and Medication management  Cole Cannon is a 62 yr old male  who presented to [facility] on 2/10, with his daughter Cole Cannon, complaining of suicidal thoughts without plan or intention, as well as substance abuse issues with multiple drugs, admitted to [facility] on 2/13.  PPHx is significant for self-reported Schizoaffective Disorder and Bipolar Affective Disorder, Polysubstance abuse, 2 prior Suicide attempts (OD and cutting wrists),  and multiple hospitalizations.    Michaiah continues to be disorganized and confused.  He reports continuing to have thoughts he would be better off not being alive but has no plans and contracts for safety  on the unit.  Due to his continued thinking disorders and depression we will further increase his Abilify.  We have told him to not call any more Rehab centers as his thinking is so disorganized and wait until we tell him to call.  We will continue to monitor.     Schizoaffective Disorder Depressed Type vs MDD w/ Psychotic features vs substance induced mood disorder: -Increase Abilify to 15 mg tomorrow -Continue Trazodone 100 mg QHS   Cocaine Use Disorder   THC Use Disorder: -Encourage abstinence    HTN: -Continue Metoprolol 50 mg BID -Continue Lisinopril 10 mg daily   -Continue PRN's: Tylenol, Maalox, Atarax, Milk of Magnesia, Trazodone   Labs on Admission: CMP: WNL except Na: 132,  CBC: WNL,  TSH: 2.466,  A1c:  5.8,  Lipid Panel:  WNL except  HDL: 32,  LDL: 101,  UDS: Amphet, Cocaine, Meth, THC positive, Resp Panel: Neg, EKG: NSR with Qtc: [place], MD 07/26/2021, 2:24 PM

## 2021-07-26 NOTE — Plan of Care (Signed)
Nurse discussed coping skills and anxiety with patient.  

## 2021-07-26 NOTE — Group Note (Signed)
Recreation Therapy Group Note   Group Topic:Stress Management  Group Date: 07/26/2021 Start Time: 0930 End Time: 0954 Facilitators: Caroll Rancher, LRT,CTRS Location: [address] Dayroom   Goal Area(s) Addresses:  Patient will actively participate in stress management techniques presented during session.  Patient will successfully identify benefit of practicing stress management post d/c.   Group Description: Guided Imagery. LRT provided education, instruction, and demonstration on practice of visualization via guided imagery. Patient was asked to participate in the technique introduced during session. LRT debriefed including topics of mindfulness, stress management and specific scenarios each patient could use these techniques. Patients were given suggestions of ways to access scripts post d/c and encouraged to explore Youtube and other apps available on smartphones, tablets, and computers.   Affect/Mood: Appropriate   Participation Level: Active   Participation Quality: Independent   Behavior: Appropriate   Speech/Thought Process: Focused   Insight: Good   Judgement: Good   Modes of Intervention: Script, [place]   Patient Response to Interventions:  Attentive   Education Outcome:  Acknowledges education and In group clarification offered    Clinical Observations/Individualized Feedback: Pt attended and participated in group activity.    Plan: Continue to engage patient in RT group sessions 2-3x/week.   Caroll Rancher, LRT,CTRS 07/26/2021 12:16 PM

## 2021-07-27 MED ORDER — TRAZODONE HCL 150 MG PO TABS
150.0000 mg | ORAL_TABLET | Freq: Every day | ORAL | Status: DC
  Administered 2021-07-27 – 2021-07-28 (×2): 150 mg via ORAL
  Filled 2021-07-27: qty 14
  Filled 2021-07-27 (×3): qty 1
  Filled 2021-07-27: qty 14

## 2021-07-27 MED ORDER — HYDROXYZINE HCL 25 MG PO TABS
25.0000 mg | ORAL_TABLET | Freq: Every day | ORAL | Status: DC
  Administered 2021-07-27 – 2021-07-28 (×2): 25 mg via ORAL
  Filled 2021-07-27 (×5): qty 1

## 2021-07-27 MED ORDER — HYDROXYZINE HCL 25 MG PO TABS
25.0000 mg | ORAL_TABLET | Freq: Three times a day (TID) | ORAL | Status: DC | PRN
  Administered 2021-07-27: 25 mg via ORAL
  Filled 2021-07-27 (×2): qty 1

## 2021-07-27 MED ORDER — HYDROXYZINE HCL 25 MG PO TABS
25.0000 mg | ORAL_TABLET | Freq: Four times a day (QID) | ORAL | Status: DC | PRN
  Administered 2021-07-27 – 2021-07-29 (×4): 25 mg via ORAL
  Filled 2021-07-27 (×2): qty 1

## 2021-07-27 NOTE — Progress Notes (Addendum)
[facility] MD Progress Note  07/27/2021 12:09 PM Cole Cannon  MRN:  [id number] Subjective:    Cole Cannon is a 61 yr old male  who presented to [facility] on 2/10, with his daughter Cole Cannon, complaining of suicidal thoughts without plan or intention, as well as substance abuse issues with multiple drugs, admitted to [facility] on 2/13.  PPHx is significant for self-reported Schizoaffective Disorder and Bipolar Affective Disorder, Polysubstance abuse, 2 prior Suicide attempts (OD and cutting wrists),  and multiple hospitalizations.    Case was discussed in the multidisciplinary team. MAR was reviewed and patient was compliant with medications.  He required PRN Atarax yesterday.   Psychiatric Team made the following recommendations yesterday: -Increase Abilify to 15 mg tomorrow -Continue Trazodone 100 mg QHS plus 50 mg qhs prn  -Continue Metoprolol 50 mg BID -Continue Lisinopril to 10 mg daily   On interview today, the patient reports that his mood is less depressed.  He reports feeling more irritable and unsure why.  He reports that his sleep was poor last night, with unknown stressor or reason.  Reports appetite is stable.  Patient reports that he feels that he is thinking more clearly.  On my assessment, the patient's thinking is much less disorganized and much more clear, linear, and logical. Patient's mood is less labile, which is a drastic improvement over the last 48 hours. Patient denies having suicidal thoughts.  Denies having homicidal thoughts Denies having auditory hallucinations.  Denies any visual hallucinations.  Denies paranoid thoughts. Denies side effects to current medications. Reports having bowel movement yesterday.   Principal Problem: Schizoaffective disorder, bipolar type (HCC) Diagnosis: Principal Problem:   Schizoaffective disorder, bipolar type (HCC) Active Problems:   Anxiety   Substance abuse (HCC)   Tobacco abuse  Total Time spent with patient:  I personally spent 30  minutes on the unit in direct patient care. The direct patient care time included face-to-face time with the patient, reviewing the patient's chart, communicating with other professionals, and coordinating care. Greater than 50% of this time was spent in counseling or coordinating care with the patient regarding goals of hospitalization, psycho-education, and discharge planning needs.   Past Psychiatric History: self-reported Schizoaffective Disorder and Bipolar Affective Disorder, Polysubstance abuse, 2 prior Suicide attempts (OD and cutting wrists),  and multiple hospitalizations.   Past Medical History:  Past Medical History:  Diagnosis Date   Asthma    Depression    Heart attack (HCC)    Hypertension    Seizures (HCC)    History reviewed. No pertinent surgical history. Family History:  Family History  Problem Relation Age of Onset   Cancer Sister    Cancer Brother    Family Psychiatric  History: Sister- Schizophrenia Father- EtOH abuse Social History:  Social History   Substance and Sexual Activity  Alcohol Use Yes   Alcohol/week: 3.0 standard drinks   Types: 3 Shots of liquor per week     Social History   Substance and Sexual Activity  Drug Use Yes   Types: Marijuana, Cocaine, Methamphetamines    Social History   Socioeconomic History   Marital status: Legally Separated    Spouse name: Not on file   Number of children: Not on file   Years of education: Not on file   Highest education level: Not on file  Occupational History   Not on file  Tobacco Use   Smoking status: Every Day    Packs/day: 1.00    Types: Cigarettes   Smokeless tobacco:  Never  Substance and Sexual Activity   Alcohol use: Yes    Alcohol/week: 3.0 standard drinks    Types: 3 Shots of liquor per week   Drug use: Yes    Types: Marijuana, Cocaine, Methamphetamines   Sexual activity: Not Currently  Other Topics Concern   Not on file  Social History Narrative   Not on file   Social  Determinants of Health   Financial Resource Strain: Not on file  Food Insecurity: Not on file  Transportation Needs: Not on file  Physical Activity: Not on file  Stress: Not on file  Social Connections: Not on file   Additional Social History:                         Sleep: Poor  Appetite:  Good  Current Medications: Current Facility-Administered Medications  Medication Dose Route Frequency Provider Last Rate Last Admin   acetaminophen (TYLENOL) tablet 650 mg  650 mg Oral Q6H PRN Oneta Rack, NP   650 mg at 07/23/21 1550   alum & mag hydroxide-simeth (MAALOX/MYLANTA) 200-200-20 MG/5ML suspension 30 mL  30 mL Oral Q4H PRN Oneta Rack, NP       ARIPiprazole (ABILIFY) tablet 15 mg  15 mg Oral Daily Lauro Franklin, MD   15 mg at 07/27/21 0810   hydrOXYzine (ATARAX) tablet 25 mg  25 mg Oral TID PRN Phineas Inches, MD   25 mg at 07/27/21 1144   lisinopril (ZESTRIL) tablet 10 mg  10 mg Oral Daily Carlyn Reichert, MD   10 mg at 07/27/21 0810   magnesium hydroxide (MILK OF MAGNESIA) suspension 30 mL  30 mL Oral Daily PRN Oneta Rack, NP       menthol-cetylpyridinium (CEPACOL) lozenge 3 mg  1 lozenge Oral PRN Melbourne Abts W, PA-C       metoprolol tartrate (LOPRESSOR) tablet 50 mg  50 mg Oral BID Carlyn Reichert, MD   50 mg at 07/27/21 0810   multivitamin with minerals tablet 1 tablet  1 tablet Oral Daily Oneta Rack, NP   1 tablet at 07/27/21 0810   thiamine tablet 100 mg  100 mg Oral Daily Oneta Rack, NP   100 mg at 07/27/21 0810   traZODone (DESYREL) tablet 100 mg  100 mg Oral QHS Arbor Cohen, Harrold Donath, MD   100 mg at 07/26/21 2208   traZODone (DESYREL) tablet 50 mg  50 mg Oral QHS PRN Phineas Inches, MD   50 mg at 07/27/21 0127    Lab Results:  Results for orders placed or performed during the hospital encounter of 07/23/21 (from the past 48 hour(s))  Comprehensive metabolic panel     Status: Abnormal   Collection Time: 07/25/21  6:28 PM   Result Value Ref Range   Sodium 132 (L) 135 - 145 mmol/L   Potassium 4.2 3.5 - 5.1 mmol/L   Chloride 101 98 - 111 mmol/L   CO2 22 22 - 32 mmol/L   Glucose, Bld 154 (H) 70 - 99 mg/dL    Comment: Glucose reference range applies only to samples taken after fasting for at least 8 hours.   BUN 16 6 - 20 mg/dL   Creatinine, Ser 3.87 0.61 - 1.24 mg/dL   Calcium 9.2 8.9 - 56.4 mg/dL   Total Protein 7.2 6.5 - 8.1 g/dL   Albumin 3.9 3.5 - 5.0 g/dL   AST 19 15 - 41 U/L   ALT 16 0 -  44 U/L   Alkaline Phosphatase 79 38 - 126 U/L   Total Bilirubin 0.4 0.3 - 1.2 mg/dL   GFR, Estimated >22 >29 mL/min    Comment: (NOTE) Calculated using the CKD-EPI Creatinine Equation (2021)    Anion gap 9 5 - 15    Comment: Performed at [facility], [address] [place], [place] [address]  CBC     Status: None   Collection Time: 07/25/21  6:28 PM  Result Value Ref Range   WBC 9.3 4.0 - 10.5 K/uL   RBC 4.68 4.22 - 5.81 MIL/uL   Hemoglobin 14.5 13.0 - 17.0 g/dL   HCT 11.9 41.7 - 40.8 %   MCV 93.6 80.0 - 100.0 fL   MCH 31.0 26.0 - 34.0 pg   MCHC 33.1 30.0 - 36.0 g/dL   RDW 14.4 81.8 - 56.3 %   Platelets 314 150 - 400 K/uL   nRBC 0.0 0.0 - 0.2 %    Comment: Performed at [facility], [address], [place], [place] [address]  TSH     Status: None   Collection Time: 07/25/21  6:28 PM  Result Value Ref Range   TSH 2.466 0.350 - 4.500 uIU/mL    Comment: Performed by a 3rd Generation assay with a functional sensitivity of <=0.01 uIU/mL. Performed at [facility], [address] [place], [place] [address]   Hemoglobin A1c     Status: Abnormal   Collection Time: 07/25/21  6:28 PM  Result Value Ref Range   Hgb A1c MFr Bld 5.8 (H) 4.8 - 5.6 %    Comment: (NOTE) Pre diabetes:          5.7%-6.4%  Diabetes:              >6.4%  Glycemic control for   <7.0% adults with diabetes    Mean Plasma Glucose 119.76 mg/dL    Comment: Performed at [facility], [address] [place], [place] [address]    Blood Alcohol level:  No results found for: [facility]  Metabolic Disorder Labs: Lab Results  Component Value Date   HGBA1C 5.8 (H) 07/25/2021   MPG 119.76 07/25/2021   No results found for: PROLACTIN Lab Results  Component Value Date   CHOL 152 07/22/2021   TRIG 94 07/22/2021   HDL 32 (L) 07/22/2021   CHOLHDL 4.8 07/22/2021   VLDL 19 07/22/2021   LDLCALC 101 (H) 07/22/2021   LDLCALC 126 (H) 03/29/2021    Physical Findings: AIMS: Facial and Oral Movements Muscles of Facial Expression: None, normal Lips and Perioral Area: None, normal Jaw: None, normal Tongue: None, normal,Extremity Movements Upper (arms, wrists, hands, fingers): None, normal Lower (legs, knees, ankles, toes): None, normal, Trunk Movements Neck, shoulders, hips: None, normal, Overall Severity Severity of abnormal movements (highest score from questions above): None, normal Incapacitation due to abnormal movements: None, normal Patient's awareness of abnormal movements (rate only patient's report): No Awareness, Dental Status Current problems with teeth and/or dentures?: No Does patient usually wear dentures?: No  CIWA:  CIWA-Ar Total: 1 COWS:     Musculoskeletal: Strength & Muscle Tone: within normal limits Gait & Station: normal Patient leans: N/A  Psychiatric Specialty Exam:  Presentation  General Appearance: Appropriate for Environment; Casual  Eye Contact: Improving  Speech:Clear and Coherent; Normal Rate  Speech Volume:Normal  Handedness:Right   Mood and Affect  Mood: Less depressed, "irritable"  Affect: Full range, euthymic appearing  Thought Process  Thought Processes: More linear and logical  Descriptions of Associations:Intact  Orientation:Full (Time, Place and Person)  Thought Content: Denies AH, VH, paranoia.  Denies SI.  Denies HI.  History of Schizophrenia/Schizoaffective disorder:Yes  Duration of Psychotic  Symptoms:No data recorded Hallucinations:Hallucinations: None  Ideas of Reference:None  Suicidal Thoughts:Suicidal Thoughts: Denies  Homicidal Thoughts:Homicidal Thoughts: No   Sensorium  Memory: Improving  Judgment: Denies  Insight: Denies  Art therapist  Concentration:Fair  Attention Span:Fair  Recall:Poor  Fund of Knowledge:Poor  Language:Fair   Psychomotor Activity  Psychomotor Activity:Psychomotor Activity: Normal  Assets  Assets:Communication Skills; Housing   Sleep  Sleep:Sleep: Good   Physical Exam: Physical Exam Vitals and nursing note reviewed.  Constitutional:      General: He is not in acute distress.    Appearance: Normal appearance. He is normal weight. He is not ill-appearing or toxic-appearing.  HENT:     Head: Normocephalic and atraumatic.  Pulmonary:     Effort: Pulmonary effort is normal.  Musculoskeletal:        General: Normal range of motion.  Neurological:     General: No focal deficit present.     Mental Status: He is alert.     Motor: No weakness.     Gait: Gait normal.   Review of Systems  Constitutional:  Negative for chills and fever.  Respiratory:  Negative for cough and shortness of breath.   Cardiovascular:  Negative for chest pain.  Gastrointestinal:  Negative for abdominal pain, constipation, diarrhea, nausea and vomiting.  Neurological:  Negative for dizziness, weakness and headaches.  Psychiatric/Behavioral:  Negative for depression, hallucinations and suicidal ideas. The patient has insomnia. The patient is not nervous/anxious.    Blood pressure (!) 133/94, pulse 68, temperature 97.8 F (36.6 C), resp. rate 19, height 5\' 10"  (1.778 m), weight 87.1 kg, SpO2 100 %. Body mass index is 27.55 kg/m.   Treatment Plan Summary: Daily contact with patient to assess and evaluate symptoms and progress in treatment and Medication management  Cole Cannon is a 61 yr old male  who presented to [facility] on 2/10, with  his daughter 4/10, complaining of suicidal thoughts without plan or intention, as well as substance abuse issues with multiple drugs, admitted to [facility] on 2/13.  PPHx is significant for self-reported Schizoaffective Disorder and Bipolar Affective Disorder, Polysubstance abuse, 2 prior Suicide attempts (OD and cutting wrists),  and multiple hospitalizations.    Schizoaffective Disorder, Bipolar type -Continue Abilify to 15 mg tomorrow -Increase trazodone from 100 mg to 150 mg QHS scheduled -Start hydroxyzine 25 mg nightly scheduled -Continue trazodone 50 mg nightly as needed   Cocaine Use Disorder   THC Use Disorder: -Encourage abstinence    HTN: -Continue Metoprolol 50 mg BID -Continue Lisinopril 10 mg daily   -Continue PRN's: Tylenol, Maalox, Atarax, Milk of Magnesia, Trazodone   Labs on Admission: CMP: WNL except Na: 132,  CBC: WNL,  TSH: 2.466,  A1c: 5.8,  Lipid Panel:  WNL except  HDL: 32,  LDL: 101,  UDS: Amphet, Cocaine, Meth, THC positive, Resp Panel: Neg, EKG: NSR with Qtc: 417   3/13, MD 07/27/2021, 12:09 PM

## 2021-07-27 NOTE — BHH Counselor (Signed)
CSW contacted [employer] concerning pt's admission status. They stated once fax had been viewed CSW will receive a call.    Fredirick Lathe, LCSWA Clinicial Social Worker [employer]

## 2021-07-27 NOTE — Progress Notes (Signed)
Pt did not attend group. 

## 2021-07-27 NOTE — Progress Notes (Signed)
°   07/26/21 2208  Psych Admission Type (Psych Patients Only)  Admission Status Voluntary  Psychosocial Assessment  Patient Complaints Sadness  Eye Contact Fair  Facial Expression Anxious  Affect Appropriate to circumstance  Speech Logical/coherent  Interaction Assertive  Motor Activity Other (Comment) (WDL)  Appearance/Hygiene Unremarkable  Behavior Characteristics Cooperative;Appropriate to situation  Mood Anxious  Thought Process  Coherency WDL  Content WDL  Delusions None reported or observed  Perception WDL  Hallucination None reported or observed  Judgment Impaired  Confusion None  Danger to Self  Current suicidal ideation? Denies  Self-Injurious Behavior No self-injurious ideation or behavior indicators observed or expressed   Agreement Not to Harm Self Yes  Description of Agreement Verbal contract  Danger to Others  Danger to Others None reported or observed

## 2021-07-27 NOTE — Plan of Care (Signed)
Nurse discussed anxiety and coping skills with patient. 

## 2021-07-27 NOTE — Progress Notes (Signed)
D:  Patient denied SI and HI, contracts for safety.  Denied A/V hallucinations.  Denied pain. A:  Medications administered per MD orders.  Emotional support and encouragement given patient. R:  Safety maintained with 15 minute checks.  

## 2021-07-27 NOTE — Progress Notes (Signed)
°   07/27/21 2112  Psych Admission Type (Psych Patients Only)  Admission Status Voluntary  Psychosocial Assessment  Patient Complaints Anxiety;Depression  Eye Contact Fair  Facial Expression Anxious  Affect Depressed  Speech Logical/coherent  Interaction Assertive  Motor Activity Other (Comment) (WDL)  Appearance/Hygiene Unremarkable  Behavior Characteristics Cooperative;Appropriate to situation  Mood Anxious  Thought Process  Coherency WDL  Content WDL  Delusions None reported or observed  Perception WDL  Hallucination None reported or observed  Judgment Impaired  Confusion None  Danger to Self  Current suicidal ideation? Denies  Self-Injurious Behavior No self-injurious ideation or behavior indicators observed or expressed   Agreement Not to Harm Self Yes  Description of Agreement Verbal contract  Danger to Others  Danger to Others None reported or observed

## 2021-07-28 ENCOUNTER — Encounter (HOSPITAL_COMMUNITY): Payer: Self-pay

## 2021-07-28 DIAGNOSIS — F250 Schizoaffective disorder, bipolar type: Principal | ICD-10-CM

## 2021-07-28 LAB — BASIC METABOLIC PANEL
Anion gap: 8 (ref 5–15)
BUN: 14 mg/dL (ref 6–20)
CO2: 24 mmol/L (ref 22–32)
Calcium: 9.4 mg/dL (ref 8.9–10.3)
Chloride: 103 mmol/L (ref 98–111)
Creatinine, Ser: 1.18 mg/dL (ref 0.61–1.24)
GFR, Estimated: >60 mL/min (ref >60)
Glucose, Bld: 118 mg/dL — ABNORMAL HIGH (ref 70–99)
Potassium: 4.3 mmol/L (ref 3.5–5.1)
Sodium: 135 mmol/L (ref 135–145)

## 2021-07-28 MED ORDER — ARIPIPRAZOLE 15 MG PO TABS: 15.0000 mg | ORAL_TABLET | Freq: Every day | ORAL | 0 refills | Status: DC

## 2021-07-28 MED ORDER — TRAZODONE HCL 150 MG PO TABS: 150.0000 mg | ORAL_TABLET | Freq: Every day | ORAL | 0 refills | Status: DC

## 2021-07-28 MED ORDER — LISINOPRIL 10 MG PO TABS: 10.0000 mg | ORAL_TABLET | Freq: Every day | ORAL | 0 refills | Status: DC

## 2021-07-28 MED ORDER — METOPROLOL TARTRATE 50 MG PO TABS: 50.0000 mg | ORAL_TABLET | Freq: Two times a day (BID) | ORAL | 0 refills | Status: DC

## 2021-07-28 MED ORDER — AMLODIPINE BESYLATE 10 MG PO TABS: 10.0000 mg | ORAL_TABLET | Freq: Every day | ORAL | 2 refills | Status: DC

## 2021-07-28 NOTE — Progress Notes (Signed)
Patient did attend the evening speaker AA meeting.  

## 2021-07-28 NOTE — Progress Notes (Addendum)
[facility] MD Progress Note  07/28/2021 2:10 PM Ichael Cannon  MRN:  [id number] Subjective:   Cole Cannon is a 61 yr old male  who presented to [facility] on 2/10, with his daughter [name], complaining of suicidal thoughts without plan or intention, as well as substance abuse issues with multiple drugs, admitted to [facility] on 2/13.  PPHx is significant for self-reported Schizoaffective Disorder and Bipolar Affective Disorder, Polysubstance abuse, 2 prior Suicide attempts (OD and cutting wrists),  and multiple hospitalizations.      Case was discussed in the multidisciplinary team. MAR was reviewed and patient was compliant with medications.  He required PRN Atarax yesterday.   Psychiatric Team made the following recommendations yesterday: -Continue Abilify to 15 mg tomorrow -Increase Trazodone from 100 mg to 150 mg QHS scheduled -Start Hydroxyzine 25 mg nightly scheduled -Continue Trazodone 50 mg nightly as needed    On interview today patient reports he slept well last night.  He reports that his appetite is doing good.  He reports no SI, HI, or AVH.  He reports no Parnoia, Ideas of Reference, or other First Rank symptoms.   He reports no issues with his medications.  He states that his mood has been more stable.  He also reports his thinking has been clear.  He states he is still learning how to be "okay with being okay."  Discussed with him that he has been accepted to the rehab facility in [place] and will be going tomorrow.  He states he thinks this will be a great step for him.  He states that he is looking forward to his daughter coming to visit tonight.  He did ask about still not having a lot of energy.  Discussed with him that he has had inconsistent sleep over the last few days where he will go from good sleep 1 night for sleep the next then back to good and so it is really difficult to tell at this point until his sleep schedule has stabilized more.  Also discussed that since he was using  substances now that they have been completely limited from his body it will take time for his energy levels to return to normal.  He reports that he is feeling a little weak today but otherwise has no other concerns at present.   Principal Problem: Schizoaffective disorder, bipolar type (HCC) Diagnosis: Principal Problem:   Schizoaffective disorder, bipolar type (HCC) Active Problems:   Anxiety   Substance abuse (HCC)   Tobacco abuse  Total Time spent with patient: 30 minutes  Past Psychiatric History: Self-reported Schizoaffective Disorder and Bipolar Affective Disorder, Polysubstance abuse, 2 prior Suicide attempts (OD and cutting wrists),  and multiple hospitalizations.  Past Medical History:  Past Medical History:  Diagnosis Date   Asthma    Depression    Heart attack (HCC)    Hypertension    Seizures (HCC)    History reviewed. No pertinent surgical history. Family History:  Family History  Problem Relation Age of Onset   Cancer Sister    Cancer Brother    Family Psychiatric  History: Sister- Schizophrenia Father- EtOH abuse Social History:  Social History   Substance and Sexual Activity  Alcohol Use Yes   Alcohol/week: 3.0 standard drinks   Types: 3 Shots of liquor per week     Social History   Substance and Sexual Activity  Drug Use Yes   Types: Marijuana, Cocaine, Methamphetamines    Social History   Socioeconomic History   Marital status:  Legally Separated    Spouse name: Not on file   Number of children: Not on file   Years of education: Not on file   Highest education level: Not on file  Occupational History   Not on file  Tobacco Use   Smoking status: Every Day    Packs/day: 1.00    Types: Cigarettes   Smokeless tobacco: Never  Substance and Sexual Activity   Alcohol use: Yes    Alcohol/week: 3.0 standard drinks    Types: 3 Shots of liquor per week   Drug use: Yes    Types: Marijuana, Cocaine, Methamphetamines   Sexual activity: Not  Currently  Other Topics Concern   Not on file  Social History Narrative   Not on file   Social Determinants of Health   Financial Resource Strain: Not on file  Food Insecurity: Not on file  Transportation Needs: Not on file  Physical Activity: Not on file  Stress: Not on file  Social Connections: Not on file   Additional Social History:                         Sleep: Good  Appetite:  Good  Current Medications: Current Facility-Administered Medications  Medication Dose Route Frequency Provider Last Rate Last Admin   acetaminophen (TYLENOL) tablet 650 mg  650 mg Oral Q6H PRN Oneta Rack, NP   650 mg at 07/23/21 1550   alum & mag hydroxide-simeth (MAALOX/MYLANTA) 200-200-20 MG/5ML suspension 30 mL  30 mL Oral Q4H PRN Oneta Rack, NP       ARIPiprazole (ABILIFY) tablet 15 mg  15 mg Oral Daily Lauro Franklin, MD   15 mg at 07/28/21 6578   hydrOXYzine (ATARAX) tablet 25 mg  25 mg Oral QHS Massengill, Harrold Donath, MD   25 mg at 07/27/21 2112   hydrOXYzine (ATARAX) tablet 25 mg  25 mg Oral Q6H PRN Massengill, Harrold Donath, MD   25 mg at 07/28/21 0828   lisinopril (ZESTRIL) tablet 10 mg  10 mg Oral Daily Carlyn Reichert, MD   10 mg at 07/28/21 4696   magnesium hydroxide (MILK OF MAGNESIA) suspension 30 mL  30 mL Oral Daily PRN Oneta Rack, NP   30 mL at 07/27/21 1704   menthol-cetylpyridinium (CEPACOL) lozenge 3 mg  1 lozenge Oral PRN Melbourne Abts W, PA-C       metoprolol tartrate (LOPRESSOR) tablet 50 mg  50 mg Oral BID Carlyn Reichert, MD   50 mg at 07/28/21 2952   multivitamin with minerals tablet 1 tablet  1 tablet Oral Daily Oneta Rack, NP   1 tablet at 07/28/21 8413   thiamine tablet 100 mg  100 mg Oral Daily Oneta Rack, NP   100 mg at 07/28/21 2440   traZODone (DESYREL) tablet 150 mg  150 mg Oral QHS Massengill, Harrold Donath, MD   150 mg at 07/27/21 2112   traZODone (DESYREL) tablet 50 mg  50 mg Oral QHS PRN Massengill, Harrold Donath, MD   50 mg at 07/27/21 0127     Lab Results:  Results for orders placed or performed during the hospital encounter of 07/23/21 (from the past 48 hour(s))  Basic metabolic panel     Status: Abnormal   Collection Time: 07/28/21  6:29 AM  Result Value Ref Range   Sodium 135 135 - 145 mmol/L   Potassium 4.3 3.5 - 5.1 mmol/L   Chloride 103 98 - 111 mmol/L   CO2 24  22 - 32 mmol/L   Glucose, Bld 118 (H) 70 - 99 mg/dL    Comment: Glucose reference range applies only to samples taken after fasting for at least 8 hours.   BUN 14 6 - 20 mg/dL   Creatinine, Ser 8.50 0.61 - 1.24 mg/dL   Calcium 9.4 8.9 - 27.7 mg/dL   GFR, Estimated >41 >28 mL/min    Comment: (NOTE) Calculated using the CKD-EPI Creatinine Equation (2021)    Anion gap 8 5 - 15    Comment: Performed at [facility], [address], [place], [place] [address]    Blood Alcohol level:  No results found for: [facility]  Metabolic Disorder Labs: Lab Results  Component Value Date   HGBA1C 5.8 (H) 07/25/2021   MPG 119.76 07/25/2021   No results found for: PROLACTIN Lab Results  Component Value Date   CHOL 152 07/22/2021   TRIG 94 07/22/2021   HDL 32 (L) 07/22/2021   CHOLHDL 4.8 07/22/2021   VLDL 19 07/22/2021   LDLCALC 101 (H) 07/22/2021   LDLCALC 126 (H) 03/29/2021    Physical Findings: AIMS: Facial and Oral Movements Muscles of Facial Expression: None, normal Lips and Perioral Area: None, normal Jaw: None, normal Tongue: None, normal,Extremity Movements Upper (arms, wrists, hands, fingers): None, normal Lower (legs, knees, ankles, toes): None, normal, Trunk Movements Neck, shoulders, hips: None, normal, Overall Severity Severity of abnormal movements (highest score from questions above): None, normal Incapacitation due to abnormal movements: None, normal Patient's awareness of abnormal movements (rate only patient's report): No Awareness, Dental Status Current problems with teeth and/or dentures?: No Does patient usually wear  dentures?: No  CIWA:  CIWA-Ar Total: 0 COWS:     Musculoskeletal: Strength & Muscle Tone: within normal limits Gait & Station: normal Patient leans: N/A  Psychiatric Specialty Exam:  Presentation  General Appearance: Appropriate for Environment; Casual; Fairly Groomed  Eye Contact:Good  Speech:Clear and Coherent; Normal Rate  Speech Volume:Normal  Handedness:Right   Mood and Affect  Mood:Euthymic  Affect:Appropriate; Congruent   Thought Process  Thought Processes:Coherent  Descriptions of Associations:Intact  Orientation:Full (Time, Place and Person)  Thought Content:Logical  History of Schizophrenia/Schizoaffective disorder:Yes  Duration of Psychotic Symptoms:No data recorded Hallucinations:Hallucinations: None  Ideas of Reference:None  Suicidal Thoughts:Suicidal Thoughts: No  Homicidal Thoughts:Homicidal Thoughts: No   Sensorium  Memory:Immediate Fair; Recent Fair  Judgment:Fair  Insight:Fair   Executive Functions  Concentration:Good  Attention Span:Good  Recall:Good  Fund of Knowledge:Good  Language:Good   Psychomotor Activity  Psychomotor Activity:Psychomotor Activity: Normal  Assets  Assets:Communication Skills; Social Support; Housing; Resilience   Sleep  Sleep:Sleep: Good   Physical Exam: Physical Exam Vitals and nursing note reviewed.  Constitutional:      General: He is not in acute distress.    Appearance: Normal appearance. He is normal weight. He is not ill-appearing or toxic-appearing.  HENT:     Head: Normocephalic and atraumatic.  Pulmonary:     Effort: Pulmonary effort is normal.  Musculoskeletal:        General: Normal range of motion.  Neurological:     General: No focal deficit present.     Mental Status: He is alert.   Review of Systems  Respiratory:  Negative for cough and shortness of breath.   Cardiovascular:  Negative for chest pain.  Gastrointestinal:  Negative for abdominal pain,  constipation, diarrhea, nausea and vomiting.  Neurological:  Positive for weakness (mild). Negative for dizziness and headaches.  Psychiatric/Behavioral:  Negative for depression,  hallucinations and suicidal ideas. The patient is not nervous/anxious.   Blood pressure (!) 118/97, pulse 97, temperature 98 F (36.7 C), temperature source Oral, resp. rate 19, height 5\' 10"  (1.778 m), weight 87.1 kg, SpO2 98 %. Body mass index is 27.55 kg/m.   Treatment Plan Summary: Daily contact with patient to assess and evaluate symptoms and progress in treatment and Medication management  Arnette Lanoux is a 61 yr old male  who presented to [facility] on 2/10, with his daughter [name], complaining of suicidal thoughts without plan or intention, as well as substance abuse issues with multiple drugs, admitted to [facility] on 2/13.  PPHx is significant for self-reported Schizoaffective Disorder and Bipolar Affective Disorder, Polysubstance abuse, 2 prior Suicide attempts (OD and cutting wrists),  and multiple hospitalizations.    Zeven has responded well to his increase in Abilify.  He is able to hold conversations much better and logically.  He has been accepted to the [facility] in [place].  He will be picked up by the facility tomorrow so we will prepare for discharge.  We will provide him with 2 weeks of medications and prescriptions for 1 month.   Schizoaffective Disorder, Bipolar type -Continue Abilify to 15 mg -Continue Trazodone 150 mg QHS scheduled -Continue Hydroxyzine 25 mg nightly scheduled -Continue Trazodone 50 mg nightly as needed     Cocaine Use Disorder   THC Use Disorder: -Encourage abstinence      HTN: -Continue Metoprolol 50 mg BID -Continue Lisinopril 10 mg daily     -Continue PRN's: Tylenol, Maalox, Atarax, Milk of Magnesia     Labs on Admission: CMP: WNL except Na: 132,  CBC: WNL,  TSH: 2.466,  A1c: 5.8,  Lipid Panel:  WNL except  HDL: 32,  LDL: 101,  UDS: Amphet, Cocaine, Meth,  THC positive, Resp Panel: Neg, EKG: NSR with Qtc: 417   Lauro Franklin, MD 07/28/2021, 2:10 PM

## 2021-07-28 NOTE — Progress Notes (Signed)
Pt described his mood as "sluggish" this morning but pt has been pleasant this shift.  Plan is to discharge pt in the morning.  Pt denies SI/HI/AVH.
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
## 2021-07-28 NOTE — Discharge Summary (Signed)
Physician Discharge Summary Note  Patient:  Cole Cannon is an 61 y.o., male MRN:  [id number] DOB:  1961/04/24 Patient phone:  [phone] (home)  Patient address:   [address] [place] [address],  Total Time spent with patient: 20 minutes  Date of Admission:  07/23/2021 Date of Discharge: 07-29-2021  Reason for Admission:    Cole Cannon is a 61 year old male with a self-reported past psychiatric history of schizoaffective disorder and bipolar affective disorder.  He came to the behavioral health urgent care as a walk-in with his daughter Cole Cannon complaining of suicidal thoughts without plan or intention, as well as substance abuse issues with multiple drugs.   Principal Problem: Schizoaffective disorder, bipolar type [facility]) Discharge Diagnoses: Principal Problem:   Schizoaffective disorder, bipolar type ([place]) Active Problems:   Anxiety   Substance abuse ([place])   Tobacco abuse   Past Psychiatric History:  Previous Psych Diagnoses: Self-reported diagnoses of schizoaffective disorder and bipolar affective disorder Prev unsuccessful med trials: Patient is unable to name his current meds or previous medications  Past Medical History:  Past Medical History:  Diagnosis Date   Asthma    Depression    Heart attack ([place])    Hypertension    Seizures ([place])    History reviewed. No pertinent surgical history. Family History:  Family History  Problem Relation Age of Onset   Cancer Sister    Cancer Brother    Family Psychiatric  History:  Psych: Reports having a sister with schizophrenia and a father with alcohol use disorder  Social History:  Social History   Substance and Sexual Activity  Alcohol Use Yes   Alcohol/week: 3.0 standard drinks   Types: 3 Shots of liquor per week     Social History   Substance and Sexual Activity  Drug Use Yes   Types: Marijuana, Cocaine, Methamphetamines    Social History   Socioeconomic History   Marital status: Legally  Separated    Spouse name: Not on file   Number of children: Not on file   Years of education: Not on file   Highest education level: Not on file  Occupational History   Not on file  Tobacco Use   Smoking status: Every Day    Packs/day: 1.00    Types: Cigarettes   Smokeless tobacco: Never  Substance and Sexual Activity   Alcohol use: Yes    Alcohol/week: 3.0 standard drinks    Types: 3 Shots of liquor per week   Drug use: Yes    Types: Marijuana, Cocaine, Methamphetamines   Sexual activity: Not Currently  Other Topics Concern   Not on file  Social History Narrative   Not on file   Social Determinants of Health   Financial Resource Strain: Not on file  Food Insecurity: Not on file  Transportation Needs: Not on file  Physical Activity: Not on file  Stress: Not on file  Social Connections: Not on file    Hospital Course:   During the patient's hospitalization, patient had extensive initial psychiatric evaluation, and follow-up psychiatric evaluations every day.   Psychiatric diagnoses provided upon initial assessment:  Schizoaffective Disorder, Bipolar type Cocaine Use Disorder THC Use Disorder   Patient's psychiatric medications were adjusted on admission:  -Discontinue Tegretol and Celexa given lack of efficacy -Start Abilify 5 mg QHS for depression and psychosis -Consider addition of Zoloft in subsequent days and monitor for affective switch   During the hospitalization, other adjustments were made to the patient's psychiatric medication regimen:  -  Abilify was increased to 15 mg once daily -Trazodone was increased to 150 mg qhs plus 50 mg qhs PRN    Gradually, patient started adjusting to milieu.   Patient's care was discussed during the interdisciplinary team meeting every day during the hospitalization.   The patient denied having side effects to prescribed psychiatric medication.   The patient reports their target psychiatric symptoms of depression,  disorganized thoughts, auditory hallucinations, and suicidal thoughts, all responded well to the psychiatric medications, and the patient reports overall benefit other psychiatric hospitalization. Supportive psychotherapy was provided to the patient. The patient also participated in regular group therapy while admitted.    Labs were reviewed with the patient, and abnormal results were discussed with the patient.   The patient denied having suicidal thoughts more than 48 hours prior to discharge.  Patient denies having homicidal thoughts.  Patient denies having auditory hallucinations.  Patient denies any visual hallucinations.  Patient denies having paranoid thoughts.   The patient is able to verbalize their individual safety plan to this provider.   It is recommended to the patient to continue psychiatric medications as prescribed, after discharge from the hospital.     It is recommended to the patient to follow up with your outpatient psychiatric provider and PCP.   Discussed with the patient, the impact of alcohol, drugs, tobacco have been there overall psychiatric and medical wellbeing, and total abstinence from substance use was recommended the patient.  Physical Findings: AIMS: Facial and Oral Movements Muscles of Facial Expression: None, normal Lips and Perioral Area: None, normal Jaw: None, normal Tongue: None, normal,Extremity Movements Upper (arms, wrists, hands, fingers): None, normal Lower (legs, knees, ankles, toes): None, normal, Trunk Movements Neck, shoulders, hips: None, normal, Overall Severity Severity of abnormal movements (highest score from questions above): None, normal Incapacitation due to abnormal movements: None, normal Patient's awareness of abnormal movements (rate only patient's report): No Awareness, Dental Status Current problems with teeth and/or dentures?: No Does patient usually wear dentures?: No  CIWA:  CIWA-Ar Total: 0 COWS:      Musculoskeletal: Strength & Muscle Tone: within normal limits Gait & Station: normal Patient leans: N/A   Psychiatric Specialty Exam:  Presentation  General Appearance: Appropriate for Environment; Casual; Fairly Groomed  Eye Contact:Good  Speech:Normal Rate  Speech Volume:Normal  Handedness:Right   Mood and Affect  Mood:Euthymic  Affect:Appropriate; Congruent; Full Range   Thought Process  Thought Processes:Linear  Descriptions of Associations:Intact  Orientation:Full (Time, Place and Person)  Thought Content:Logical  History of Schizophrenia/Schizoaffective disorder:Yes  Duration of Psychotic Symptoms:No data recorded Hallucinations:Hallucinations: None  Ideas of Reference:None  Suicidal Thoughts:Suicidal Thoughts: No  Homicidal Thoughts:Homicidal Thoughts: No   Sensorium  Memory:Immediate Good; Recent Good; Remote Good  Judgment:Fair  Insight:Fair   Executive Functions  Concentration:Fair  Attention Span:Fair  Recall:Good  Fund of Knowledge:Good  Language:Good   Psychomotor Activity  Psychomotor Activity:Psychomotor Activity: Normal   Assets  Assets:Communication Skills; Social Support; Housing; Resilience   Sleep  Sleep:Sleep: Good    Physical Exam: Physical Exam Vitals reviewed.  Constitutional:      General: He is not in acute distress.    Appearance: He is not ill-appearing or toxic-appearing.  Pulmonary:     Effort: Pulmonary effort is normal.  Neurological:     Mental Status: He is alert.     Motor: No weakness.     Gait: Gait normal.  Psychiatric:        Mood and Affect: Mood normal.  Behavior: Behavior normal.        Thought Content: Thought content normal.        Judgment: Judgment normal.   Review of Systems  Constitutional:  Negative for chills and fever.  Cardiovascular:  Negative for chest pain and palpitations.  Neurological:  Negative for dizziness, tingling, tremors and headaches.   Psychiatric/Behavioral:  Positive for substance abuse. Negative for depression, hallucinations and suicidal ideas. The patient is not nervous/anxious and does not have insomnia.   Blood pressure (!) 134/99, pulse (!) 112, temperature 98 F (36.7 C), temperature source Oral, resp. rate 19, height 5\' 10"  (1.778 m), weight 87.1 kg, SpO2 99 %. Body mass index is 27.55 kg/m.   Social History   Tobacco Use  Smoking Status Every Day   Packs/day: 1.00   Types: Cigarettes  Smokeless Tobacco Never   Tobacco Cessation:  N/A, patient does not currently use tobacco products   Blood Alcohol level:  No results found for: [facility]  Metabolic Disorder Labs:  Lab Results  Component Value Date   HGBA1C 5.8 (H) 07/25/2021   MPG 119.76 07/25/2021   No results found for: PROLACTIN Lab Results  Component Value Date   CHOL 152 07/22/2021   TRIG 94 07/22/2021   HDL 32 (L) 07/22/2021   CHOLHDL 4.8 07/22/2021   VLDL 19 07/22/2021   LDLCALC 101 (H) 07/22/2021   LDLCALC 126 (H) 03/29/2021    See Psychiatric Specialty Exam and Suicide Risk Assessment completed by Attending Physician prior to discharge.  Discharge destination:  Other:  [facility]  Is patient on multiple antipsychotic therapies at discharge:  No   Has Patient had three or more failed trials of antipsychotic monotherapy by history:  No  Recommended Plan for Multiple Antipsychotic Therapies: NA  Discharge Instructions     Diet - low sodium heart healthy   Complete by: As directed    Increase activity slowly   Complete by: As directed       Allergies as of 07/28/2021       Reactions   Penicillins Anaphylaxis        Medication List     STOP taking these medications    amLODipine 10 MG tablet Commonly known as: NORVASC   carbamazepine 200 MG tablet Commonly known as: TEGRETOL   citalopram 20 MG tablet Commonly known as: CELEXA       TAKE these medications      Indication  ARIPiprazole 15  MG tablet Commonly known as: ABILIFY Take 1 tablet (15 mg total) by mouth daily. Start taking on: July 29, 2021  Indication: Major Depressive Disorder   lisinopril 10 MG tablet Commonly known as: ZESTRIL Take 1 tablet (10 mg total) by mouth daily. Start taking on: July 29, 2021 What changed:  medication strength See the new instructions.  Indication: High Blood Pressure Disorder   metoprolol tartrate 50 MG tablet Commonly known as: LOPRESSOR Take 1 tablet (50 mg total) by mouth 2 (two) times daily.  Indication: High Blood Pressure Disorder   traZODone 150 MG tablet Commonly known as: DESYREL Take 1 tablet (150 mg total) by mouth at bedtime.  Indication: Major Depressive Disorder        Follow-up [facility] Follow up.   Why: You have been accepted to this facility for substance use treatment. Your admission will be on 07/29/2021 and the facility will pick you up from the hospital and transport you there. Contact information: [place]  [address]  [id number] [phone]                 Follow-up recommendations:     Activity: as tolerated   Diet: heart healthy   Other: -Follow-up with your outpatient psychiatric provider -instructions on appointment date, time, and address (location) are provided to you in discharge paperwork.   -Take your psychiatric medications as prescribed at discharge - instructions are provided to you in the discharge paperwork   -Follow-up with outpatient primary care doctor and other specialists -for management of chronic medical disease, including: history of MI   -Testing: Follow-up with outpatient provider for abnormal lab results:  07-25-21: hba1c: 5.8 07-22-21: abnormal lipid panel      -Recommend abstinence from alcohol, tobacco, and other illicit drug use at discharge.    -If your psychiatric symptoms recur, worsen, or if you have side effects to your psychiatric medications,  call your outpatient psychiatric provider, [phone] or go to the nearest emergency department.   -If suicidal thoughts recur, call your outpatient psychiatric provider, 911, [phone] or go to the nearest emergency department.   Signed: Christoper Allegra, MD 07/28/2021, 5:47 PM    Total Time Spent in Direct Patient Care:  I personally spent 45 minutes on the unit in direct patient care on 07-28-2021. The direct patient care time included face-to-face time with the patient, reviewing the patient's chart, communicating with other professionals, and coordinating care. Greater than 50% of this time was spent in counseling or coordinating care with the patient regarding goals of hospitalization, psycho-education, and discharge planning needs.   Janine Limbo, MD Psychiatrist

## 2021-07-28 NOTE — BHH Counselor (Signed)
CSW spoke to [place] at [facility] who states pt has been accepted to their facility and will be picked up 07/29/2021 at [facility], [place] Worker [employer]

## 2021-07-28 NOTE — Group Note (Signed)
Recreation Therapy Group Note   Group Topic:Stress Management  Group Date: 07/28/2021 Start Time: 0930 End Time: 0950 Facilitators: Caroll Rancher, [place] Location: [address] Dayroom   Goal Area(s) Addresses:  Patient will identify positive stress management techniques. Patient will identify benefits of using stress management post d/c.   Group Description:  Meditation.  LRT played a meditation that focused on letting go of the past.  Patients were listen to the meditation while focusing on their breathing and allowing the breathing to relax them as much as possible.  After the meditation, LRT explained to patients about accessing stress management tools through Apps, Youtube, yoga, etc.    Affect/Mood: Appropriate   Participation Level: Active   Participation Quality: Independent   Behavior: Attentive    Speech/Thought Process: Focused   Insight: Good   Judgement: Good   Modes of Intervention: Meditation   Patient Response to Interventions:  Attentive   Education Outcome:  Acknowledges education and In group clarification offered    Clinical Observations/Individualized Feedback:  Pt attended and participated in group activity.     Plan: Continue to engage patient in RT group sessions 2-3x/week.   Caroll Rancher, LRT,CTRS 07/28/2021 11:28 AM

## 2021-07-28 NOTE — Group Note (Deleted)
LCSW Group Therapy Note ° ° °Group Date: 07/28/2021 °Start Time: 1300 °End Time: 1400 ° ° °Type of Therapy and Topic:  Group Therapy:  ° °Participation Level:  {BHH PARTICIPATION LEVEL:22264} ° °Description of Group: ° ° °Therapeutic Goals: ° °1.   ° ° °Summary of Patient Progress:   ° °*** ° °Therapeutic Modalities:  ° °Jalani Rominger E Sharicka Pogorzelski, LCSWA °07/28/2021  1:45 PM   ° °

## 2021-07-28 NOTE — BH IP Treatment Plan (Signed)
Interdisciplinary Treatment and Diagnostic Plan Update  07/28/2021 Cole Cannon MRN: [id number]  Principal Diagnosis: Schizoaffective disorder, bipolar type [facility])  Secondary Diagnoses: Principal Problem:   Schizoaffective disorder, bipolar type (HCC) Active Problems:   Anxiety   Substance abuse (HCC)   Tobacco abuse   Current Medications:  Current Facility-Administered Medications  Medication Dose Route Frequency Provider Last Rate Last Admin   acetaminophen (TYLENOL) tablet 650 mg  650 mg Oral Q6H PRN Oneta Rack, NP   650 mg at 07/23/21 1550   alum & mag hydroxide-simeth (MAALOX/MYLANTA) 200-200-20 MG/5ML suspension 30 mL  30 mL Oral Q4H PRN Oneta Rack, NP       ARIPiprazole (ABILIFY) tablet 15 mg  15 mg Oral Daily Lauro Franklin, MD   15 mg at 07/28/21 3220   hydrOXYzine (ATARAX) tablet 25 mg  25 mg Oral QHS Massengill, Harrold Donath, MD   25 mg at 07/27/21 2112   hydrOXYzine (ATARAX) tablet 25 mg  25 mg Oral Q6H PRN Phineas Inches, MD   25 mg at 07/28/21 0828   lisinopril (ZESTRIL) tablet 10 mg  10 mg Oral Daily Carlyn Reichert, MD   10 mg at 07/28/21 2542   magnesium hydroxide (MILK OF MAGNESIA) suspension 30 mL  30 mL Oral Daily PRN Oneta Rack, NP   30 mL at 07/27/21 1704   menthol-cetylpyridinium (CEPACOL) lozenge 3 mg  1 lozenge Oral PRN Melbourne Abts W, PA-C       metoprolol tartrate (LOPRESSOR) tablet 50 mg  50 mg Oral BID Carlyn Reichert, MD   50 mg at 07/28/21 7062   multivitamin with minerals tablet 1 tablet  1 tablet Oral Daily Oneta Rack, NP   1 tablet at 07/28/21 3762   thiamine tablet 100 mg  100 mg Oral Daily Oneta Rack, NP   100 mg at 07/28/21 8315   traZODone (DESYREL) tablet 150 mg  150 mg Oral QHS Massengill, Harrold Donath, MD   150 mg at 07/27/21 2112   traZODone (DESYREL) tablet 50 mg  50 mg Oral QHS PRN Phineas Inches, MD   50 mg at 07/27/21 0127   PTA Medications: Medications Prior to Admission  Medication Sig Dispense Refill Last  Dose   amLODipine (NORVASC) 10 MG tablet Take 1 tablet (10 mg total) by mouth daily. 30 tablet 2    lisinopril (ZESTRIL) 20 MG tablet TAKE ONE TABLET BY MOUTH DAILY AT 9AM 30 tablet 1     Patient Stressors: Financial difficulties   Marital or family conflict   Substance abuse    Patient Strengths: Ability for insight  Average or above average intelligence  Capable of independent living  Printmaker for treatment/growth  Supportive family/friends   Treatment Modalities: Medication Management, Group therapy, Case management,  1 to 1 session with clinician, Psychoeducation, Recreational therapy.   Physician Treatment Plan for Primary Diagnosis: Schizoaffective disorder, bipolar type (HCC) Long Term Goal(s): Improvement in symptoms so as ready for discharge   Short Term Goals: Ability to identify changes in lifestyle to reduce recurrence of condition will improve Ability to verbalize feelings will improve Ability to disclose and discuss suicidal ideas  Medication Management: Evaluate patient's response, side effects, and tolerance of medication regimen.  Therapeutic Interventions: 1 to 1 sessions, Unit Group sessions and Medication administration.  Evaluation of Outcomes: Progressing  Physician Treatment Plan for Secondary Diagnosis: Principal Problem:   Schizoaffective disorder, bipolar type (HCC) Active Problems:   Anxiety   Substance abuse (HCC)  Tobacco abuse  Long Term Goal(s): Improvement in symptoms so as ready for discharge   Short Term Goals: Ability to identify changes in lifestyle to reduce recurrence of condition will improve Ability to verbalize feelings will improve Ability to disclose and discuss suicidal ideas     Medication Management: Evaluate patient's response, side effects, and tolerance of medication regimen.  Therapeutic Interventions: 1 to 1 sessions, Unit Group sessions and Medication administration.  Evaluation of Outcomes:  Progressing   RN Treatment Plan for Primary Diagnosis: Schizoaffective disorder, bipolar type (HCC) Long Term Goal(s): Knowledge of disease and therapeutic regimen to maintain health will improve  Short Term Goals: Ability to demonstrate self-control, Ability to participate in decision making will improve, and Ability to verbalize feelings will improve  Medication Management: RN will administer medications as ordered by provider, will assess and evaluate patient's response and provide education to patient for prescribed medication. RN will report any adverse and/or side effects to prescribing provider.  Therapeutic Interventions: 1 on 1 counseling sessions, Psychoeducation, Medication administration, Evaluate responses to treatment, Monitor vital signs and CBGs as ordered, Perform/monitor CIWA, COWS, AIMS and Fall Risk screenings as ordered, Perform wound care treatments as ordered.  Evaluation of Outcomes: Progressing   LCSW Treatment Plan for Primary Diagnosis: Schizoaffective disorder, bipolar type (HCC) Long Term Goal(s): Safe transition to appropriate next level of care at discharge, Engage patient in therapeutic group addressing interpersonal concerns.  Short Term Goals: Engage patient in aftercare planning with referrals and resources, Increase emotional regulation, Facilitate acceptance of mental health diagnosis and concerns, and Facilitate patient progression through stages of change regarding substance use diagnoses and concerns  Therapeutic Interventions: Assess for all discharge needs, 1 to 1 time with Social worker, Explore available resources and support systems, Assess for adequacy in community support network, Educate family and significant other(s) on suicide prevention, Complete Psychosocial Assessment, Interpersonal group therapy.  Evaluation of Outcomes: Progressing   Progress in Treatment: Attending groups: Yes. Participating in groups: Yes. Taking medication as  prescribed: Yes. Toleration medication: Yes. Family/Significant other contact made: Yes, individual(s) contacted:  daughter Patient understands diagnosis: Yes. Discussing patient identified problems/goals with staff: Yes. Medical problems stabilized or resolved: Yes. Denies suicidal/homicidal ideation: Yes. Issues/concerns per patient self-inventory: No. Other: None  New problem(s) identified: No, Describe:  None  New Short Term/Long Term Goal(s):medication stabilization, elimination of SI thoughts, development of comprehensive mental wellness plan.   Patient Goals:  "Stabilization"  Discharge Plan or Barriers: Pt has been accepted to [facility] for substance use treatment.   Reason for Continuation of Hospitalization: Medication stabilization  Estimated Length of Stay: 3-5 days   Scribe for Treatment Team: Chrys Racer 07/28/2021 1:28 PM

## 2021-07-29 NOTE — BHH Group Notes (Signed)
Goals Group 07/29/2021   Group Focus: affirmation, clarity of thought, and goals/reality orientation Treatment Modality:  Psychoeducation Interventions utilized were assignment, group exercise, and support Purpose: To be able to understand and verbalize the reason for their admission to the hospital. To understand that the medication helps with their chemical imbalance but they also need to work on their choices in life. To be challenged to develop a list of 30 positives about themselves. Also introduce the concept that "feelings" are not reality.  Participation Level:  Active  Participation Quality:  Appropriate  Affect:  Appropriate  Cognitive:  Appropriate  Insight:  Improving  Engagement in Group:  Engaged  Additional Comments:  rates his energy at a 10/10. States that he is here to get better. Pt did  have an issue with this Clinical research associate stating "being in the hospital is hard". States, "you say hard, , it makes it feel like we will not get better. That it will be too hard to". Pt given reassurance that he will get better if he works on his health.  [place] Housekeeper

## 2021-07-29 NOTE — Progress Notes (Signed)
D. Pt presented with an anxious affect/ pleasant mood- rated his depression, hopelessness and anxiety a 3/4/4, respectively.  Pt currently denies SI/HI and AVH  A. Labs and vitals monitored. Pt given and educated on medications. Pt supported emotionally and encouraged to express concerns and ask questions.   R. Pt remains safe with 15 minute checks. Will continue POC.

## 2021-07-29 NOTE — Progress Notes (Addendum)
°  On assessment, pt presents with moderate anxiety.  Pt reports discharging tomorrow.  Pt states, "this is the best decision he ever made was coming here; gave me a new chance at life."  Pt denies SI/HI/AVH.  Pt verbally contracts for safety.  Medications administered.  Pt is safe on the unit with Q 15 minute safety checks.    07/28/21 2129  Psych Admission Type (Psych Patients Only)  Admission Status Voluntary  Psychosocial Assessment  Patient Complaints Anxiety  Eye Contact Fair  Facial Expression Anxious  Affect Depressed  Speech Logical/coherent  Interaction Assertive  Motor Activity Other (Comment) (WDL)  Appearance/Hygiene Unremarkable  Behavior Characteristics Cooperative;Appropriate to situation  Mood Pleasant;Anxious  Thought Process  Coherency WDL  Content WDL  Delusions None reported or observed  Perception WDL  Hallucination None reported or observed  Judgment Impaired  Confusion None  Danger to Self  Current suicidal ideation? Denies  Self-Injurious Behavior No self-injurious ideation or behavior indicators observed or expressed   Agreement Not to Harm Self Yes  Description of Agreement verbally contracted for safety  Danger to Others  Danger to Others None reported or observed

## 2021-07-29 NOTE — Progress Notes (Signed)
[facility] MD Progress Note  07/29/2021 7:13 AM Cole Cannon  MRN:  [id number]  Reason for Admission: Cole Cannon is a 61 yr old male  who presented to [facility] on 2/10, with his daughter Cole Cannon, complaining of suicidal thoughts without plan or intention, as well as substance abuse issues with multiple drugs, admitted to [facility] on 2/13.  PPHx is significant for self-reported Schizoaffective Disorder and Bipolar Affective Disorder, Polysubstance abuse, 2 prior Suicide attempts (OD and cutting wrists),  and multiple hospitalizations.    Chart Review from last 24 hours:  The patient's chart was reviewed and nursing notes were reviewed. The patient's case was discussed in multidisciplinary team meeting.  Per nursing he had no acute behavioral issues or safety concerns noted per [facility] he was compliant with scheduled medications and did receive as needed x2 for anxiety yesterday.  Information Obtained Today During Patient Interview: The patient was seen and evaluated on the unit. On assessment today the patient reports that he slept well and has a good appetite.  He denies SI, HI, AVH or paranoia.  He states his mood is "great" and he is looking forward to transition to [facility] today.  He voices no physical complaints.  Time was spent discussing that he has a hemoglobin A1c in the prediabetic range which needs to be followed as an outpatient.  He was also made aware that his white count was mildly elevated which was likely a stress response on admission.  He was advised to have his white count rechecked by his primary care provider after discharge.  Additionally time was spent discussing the need for ongoing metabolic lab monitoring, CBC, EKG aims and weight monitoring while on Abilify.  Time was given for questions.  Principal Problem: Schizoaffective disorder, bipolar type (HCC) Diagnosis: Principal Problem:   Schizoaffective disorder, bipolar type (HCC) Active Problems:   Anxiety   Substance  abuse (HCC)   Tobacco abuse  Total Time Spent in Direct Patient Care:  I personally spent 20 minutes on the unit in direct patient care. The direct patient care time included face-to-face time with the patient, reviewing the patient's chart, communicating with other professionals, and coordinating care. Greater than 50% of this time was spent in counseling or coordinating care with the patient regarding goals of hospitalization, psycho-education, and discharge planning needs.   Past Psychiatric History: Self-reported Schizoaffective Disorder and Bipolar Affective Disorder, Polysubstance abuse, 2 prior Suicide attempts (OD and cutting wrists),  and multiple hospitalizations.  Past Medical History:  Past Medical History:  Diagnosis Date   Asthma    Depression    Heart attack (HCC)    Hypertension    Seizures (HCC)    History reviewed. No pertinent surgical history. Family History:  Family History  Problem Relation Age of Onset   Cancer Sister    Cancer Brother    Family Psychiatric  History: Sister- Schizophrenia Father- EtOH abuse  Social History:  Social History   Substance and Sexual Activity  Alcohol Use Yes   Alcohol/week: 3.0 standard drinks   Types: 3 Shots of liquor per week     Social History   Substance and Sexual Activity  Drug Use Yes   Types: Marijuana, Cocaine, Methamphetamines    Social History   Socioeconomic History   Marital status: Legally Separated    Spouse name: Not on file   Number of children: Not on file   Years of education: Not on file   Highest education level: Not on file  Occupational  History   Not on file  Tobacco Use   Smoking status: Every Day    Packs/day: 1.00    Types: Cigarettes   Smokeless tobacco: Never  Substance and Sexual Activity   Alcohol use: Yes    Alcohol/week: 3.0 standard drinks    Types: 3 Shots of liquor per week   Drug use: Yes    Types: Marijuana, Cocaine, Methamphetamines   Sexual activity: Not Currently   Other Topics Concern   Not on file  Social History Narrative   Not on file   Social Determinants of Health   Financial Resource Strain: Not on file  Food Insecurity: Not on file  Transportation Needs: Not on file  Physical Activity: Not on file  Stress: Not on file  Social Connections: Not on file     Sleep: Good  Appetite:  Good  Current Medications: Current Facility-Administered Medications  Medication Dose Route Frequency Provider Last Rate Last Admin   acetaminophen (TYLENOL) tablet 650 mg  650 mg Oral Q6H PRN Oneta Rack, NP   650 mg at 07/23/21 1550   alum & mag hydroxide-simeth (MAALOX/MYLANTA) 200-200-20 MG/5ML suspension 30 mL  30 mL Oral Q4H PRN Oneta Rack, NP       ARIPiprazole (ABILIFY) tablet 15 mg  15 mg Oral Daily Lauro Franklin, MD   15 mg at 07/28/21 9937   hydrOXYzine (ATARAX) tablet 25 mg  25 mg Oral QHS Massengill, Harrold Donath, MD   25 mg at 07/28/21 2129   hydrOXYzine (ATARAX) tablet 25 mg  25 mg Oral Q6H PRN Phineas Inches, MD   25 mg at 07/29/21 0640   lisinopril (ZESTRIL) tablet 10 mg  10 mg Oral Daily Carlyn Reichert, MD   10 mg at 07/28/21 1696   magnesium hydroxide (MILK OF MAGNESIA) suspension 30 mL  30 mL Oral Daily PRN Oneta Rack, NP   30 mL at 07/27/21 1704   menthol-cetylpyridinium (CEPACOL) lozenge 3 mg  1 lozenge Oral PRN Melbourne Abts W, PA-C       metoprolol tartrate (LOPRESSOR) tablet 50 mg  50 mg Oral BID Carlyn Reichert, MD   50 mg at 07/28/21 1811   multivitamin with minerals tablet 1 tablet  1 tablet Oral Daily Oneta Rack, NP   1 tablet at 07/28/21 7893   thiamine tablet 100 mg  100 mg Oral Daily Oneta Rack, NP   100 mg at 07/28/21 8101   traZODone (DESYREL) tablet 150 mg  150 mg Oral QHS Massengill, Harrold Donath, MD   150 mg at 07/28/21 2130   traZODone (DESYREL) tablet 50 mg  50 mg Oral QHS PRN Phineas Inches, MD   50 mg at 07/27/21 0127    Lab Results:  Results for orders placed or performed during the  hospital encounter of 07/23/21 (from the past 48 hour(s))  Basic metabolic panel     Status: Abnormal   Collection Time: 07/28/21  6:29 AM  Result Value Ref Range   Sodium 135 135 - 145 mmol/L   Potassium 4.3 3.5 - 5.1 mmol/L   Chloride 103 98 - 111 mmol/L   CO2 24 22 - 32 mmol/L   Glucose, Bld 118 (H) 70 - 99 mg/dL    Comment: Glucose reference range applies only to samples taken after fasting for at least 8 hours.   BUN 14 6 - 20 mg/dL   Creatinine, Ser 7.51 0.61 - 1.24 mg/dL   Calcium 9.4 8.9 - 02.5 mg/dL   GFR,  Estimated >60 >60 mL/min    Comment: (NOTE) Calculated using the CKD-EPI Creatinine Equation (2021)    Anion gap 8 5 - 15    Comment: Performed at [facility], [address] [place], [place] [address]    Blood Alcohol level:  No results found for: [facility]  Metabolic Disorder Labs: Lab Results  Component Value Date   HGBA1C 5.8 (H) 07/25/2021   MPG 119.76 07/25/2021   No results found for: PROLACTIN Lab Results  Component Value Date   CHOL 152 07/22/2021   TRIG 94 07/22/2021   HDL 32 (L) 07/22/2021   CHOLHDL 4.8 07/22/2021   VLDL 19 07/22/2021   LDLCALC 101 (H) 07/22/2021   LDLCALC 126 (H) 03/29/2021    Physical Findings: AIMS: Facial and Oral Movements Muscles of Facial Expression: None, normal Lips and Perioral Area: None, normal Jaw: None, normal Tongue: None, normal,Extremity Movements Upper (arms, wrists, hands, fingers): None, normal Lower (legs, knees, ankles, toes): None, normal, Trunk Movements Neck, shoulders, hips: None, normal, Overall Severity Severity of abnormal movements (highest score from questions above): None, normal Incapacitation due to abnormal movements: None, normal Patient's awareness of abnormal movements (rate only patient's report): No Awareness, Dental Status Current problems with teeth and/or dentures?: No Does patient usually wear dentures?: No  CIWA:  CIWA-Ar Total: 2  Musculoskeletal: Strength &  Muscle Tone: within normal limits Gait & Station: normal Patient leans: N/A  Psychiatric Specialty Exam:  Presentation  General Appearance: Appropriate for Environment; Casual; Fairly Groomed  Eye Contact:Good  Speech:Normal Rate  Speech Volume:Normal  Mood and Affect  Mood:Euthymic  Affect:Appropriate; Congruent; Full Range   Thought Process  Thought Processes:linear goal directed  Descriptions of Associations:Intact  Orientation:Full (Time, Place and Person)  Thought Content:Denies SI, HI, AVH paranoia or delusions  Hallucinations:Hallucinations: None  Ideas of Reference:None  Suicidal Thoughts:Suicidal Thoughts: No  Homicidal Thoughts:Homicidal Thoughts: No   Sensorium  Memory:Immediate Good; Recent Good; Remote Good  Judgment:Fair  Insight:Fair   Executive Functions  Concentration:Fair  Attention Span:Fair  Recall:Good  Fund of Knowledge:Good  Language:Good   Psychomotor Activity  Psychomotor Activity:Psychomotor Activity: Normal  Assets  Assets:Communication Skills; Social Support; Housing; Resilience   Physical Exam Vitals and nursing note reviewed.  Constitutional:      General: He is not in acute distress.    Appearance: Normal appearance. He is normal weight. He is not ill-appearing or toxic-appearing.  HENT:     Head: Normocephalic and atraumatic.  Pulmonary:     Effort: Pulmonary effort is normal.  Musculoskeletal:        General: Normal range of motion.  Neurological:     General: No focal deficit present.     Mental Status: He is alert.   Review of Systems  Respiratory:  Negative for shortness of breath.   Cardiovascular:  Negative for chest pain.  Gastrointestinal:  Negative for constipation, diarrhea, nausea and vomiting.  Blood pressure (!) 123/100, pulse 94, temperature 98.5 F (36.9 C), resp. rate 16, height 5\' 10"  (1.778 m), weight 87.1 kg, SpO2 99 %. Body mass index is 27.55 kg/m.   Treatment Plan  Summary: Daily contact with patient to assess and evaluate symptoms and progress in treatment and Medication management  Schizoaffective Disorder, Bipolar type -Continue Abilify 15 mg -Continue Trazodone 150 mg QHS scheduled -Continue Hydroxyzine 25 mg nightly scheduled -Continue Trazodone 50 mg nightly as needed   Cocaine Use Disorder   THC Use Disorder: -Encourage abstinence  - Going to [facility] treatment center  today   HTN: -Continue Metoprolol 50 mg BID -Continue Lisinopril 10 mg daily  Mildly elevated A1c at 5.8 and mildly elevated WBC 11.8 - Advised to see PCP for recheck after discharge   Labs on Admission: CMP: WNL except Na: 132,  CBC: WNL,  TSH: 2.466,  A1c: 5.8,  Lipid Panel:  WNL except  HDL: 32,  LDL: 101,  UDS: Amphet, Cocaine, Meth, THC positive, Resp Panel: Neg, EKG: NSR with Qtc: 417   Karnell Vanderloop Max Sane, MD, FAPA 07/29/2021, 7:13 AM

## 2021-07-29 NOTE — Progress Notes (Signed)
Pt discharged to lobby. Pt was stable and appreciative at that time. All papers, samples, and prescriptions were given and valuables returned. Verbal understanding expressed. Denies SI/HI and A/VH. Pt given opportunity to express concerns and ask questions. 

## 2021-07-29 NOTE — Group Note (Signed)
LCSW Group Therapy Note  07/29/2021     Type of Therapy and Topic:  Group Therapy: Anger and Coping Skills  Participation Level:  Active   Description of Group:   In this group, patients learned how to recognize the physical, cognitive, emotional, and behavioral responses they have to anger-provoking situations.  They identified how they usually or often react when angered, and learned how healthy and unhealthy coping skills work initially, but the unhealthy ones stop working.   They analyzed how their frequently-chosen coping skill is possibly beneficial and how it is possibly unhelpful.  The group discussed a variety of healthier coping skills that could help in resolving the actual issues, as well as how to go about planning for the the possibility of future similar situations.  Therapeutic Goals: Patients will identify one thing that makes them angry and how they feel emotionally and physically, what their thoughts are or tend to be in those situations, and what healthy or unhealthy coping mechanism they typically use Patients will identify how their coping technique works for them, as well as how it works against them. Patients will explore possible new behaviors to use in future anger situations. Patients will learn that anger itself is normal and cannot be eliminated, and that healthier coping skills can assist with resolving conflict rather than worsening situations.  Summary of Patient Progress:  The patient shared that he often is angered by people crossing boundaries and being disrespectful particularly at the table and chooses to cope with these feelings by trying to walk away.  The patient was very interactive and contributed heavily to the group discussion.  Therapeutic Modalities:   Cognitive Behavioral Therapy   Maretta Los  .

## 2021-07-29 NOTE — Plan of Care (Signed)
°  Problem: Coping: Goal: Coping ability will improve Outcome: Progressing Goal: Will verbalize feelings Outcome: Progressing   Problem: Health Behavior/Discharge Planning: Goal: Ability to make decisions will improve Outcome: Progressing Goal: Compliance with therapeutic regimen will improve Outcome: Progressing   

## 2021-07-29 NOTE — Progress Notes (Signed)
°  [facility] Adult Case Management Discharge Plan :  Will you be returning to the same living situation after discharge:  No.  Going to facility for further treatment At discharge, do you have transportation home?: Yes,  provided by facility Do you have the ability to pay for your medications: Yes,  insurance  Release of information consent forms completed and emailed to Medical Records, then turned in to Medical Records by CSW.   Patient to Follow up at:  [facility].   Why: You have been accepted to this facility for substance use treatment. Your admission will be on 07/29/2021 and the facility will pick you up from the hospital and transport you there. Contact information: [address] [place] [address] [phone]                 Next level of care provider has access to [place] and Suicide Prevention discussed: Yes,  with daughter     Has patient been referred to the Quitline?: N/A - patient going to facility  Patient has been referred for addiction treatment: Yes  Maretta Los, LCSW 07/29/2021, 11:23 AM

## 2021-08-03 ENCOUNTER — Other Ambulatory Visit: Payer: Self-pay | Admitting: Nurse Practitioner

## 2021-08-25 ENCOUNTER — Other Ambulatory Visit: Payer: Self-pay | Admitting: Nurse Practitioner

## 2021-08-28 ENCOUNTER — Other Ambulatory Visit: Payer: Self-pay | Admitting: Nurse Practitioner

## 2021-08-30 ENCOUNTER — Other Ambulatory Visit: Payer: Self-pay | Admitting: Nurse Practitioner

## 2021-08-30 DIAGNOSIS — Z760 Encounter for issue of repeat prescription: Secondary | ICD-10-CM

## 2021-08-30 DIAGNOSIS — I10 Essential (primary) hypertension: Secondary | ICD-10-CM

## 2021-09-14 ENCOUNTER — Emergency Department (HOSPITAL_COMMUNITY): Payer: Medicare Other

## 2021-09-14 ENCOUNTER — Other Ambulatory Visit: Payer: Self-pay

## 2021-09-14 ENCOUNTER — Emergency Department (HOSPITAL_COMMUNITY)
Admission: EM | Admit: 2021-09-14 | Discharge: 2021-09-14 | Disposition: A | Discharge status: Home / Self Care | Payer: Medicare Other | Attending: Emergency Medicine | Admitting: Emergency Medicine

## 2021-09-14 ENCOUNTER — Encounter (HOSPITAL_COMMUNITY): Payer: Self-pay

## 2021-09-14 DIAGNOSIS — Y900 Blood alcohol level of less than 20 mg/100 ml: Secondary | ICD-10-CM | POA: Diagnosis not present

## 2021-09-14 DIAGNOSIS — J45909 Unspecified asthma, uncomplicated: Secondary | ICD-10-CM | POA: Diagnosis not present

## 2021-09-14 DIAGNOSIS — R112 Nausea with vomiting, unspecified: Secondary | ICD-10-CM | POA: Insufficient documentation

## 2021-09-14 DIAGNOSIS — R1013 Epigastric pain: Secondary | ICD-10-CM | POA: Insufficient documentation

## 2021-09-14 DIAGNOSIS — Z79899 Other long term (current) drug therapy: Secondary | ICD-10-CM | POA: Diagnosis not present

## 2021-09-14 DIAGNOSIS — I2510 Atherosclerotic heart disease of native coronary artery without angina pectoris: Secondary | ICD-10-CM | POA: Diagnosis not present

## 2021-09-14 DIAGNOSIS — R109 Unspecified abdominal pain: Secondary | ICD-10-CM | POA: Diagnosis not present

## 2021-09-14 DIAGNOSIS — I10 Essential (primary) hypertension: Secondary | ICD-10-CM | POA: Insufficient documentation

## 2021-09-14 DIAGNOSIS — I700 Atherosclerosis of aorta: Secondary | ICD-10-CM | POA: Diagnosis not present

## 2021-09-14 LAB — RAPID URINE DRUG SCREEN, HOSP PERFORMED
Amphetamines: NOT DETECTED
Barbiturates: NOT DETECTED
Benzodiazepines: NOT DETECTED
Cocaine: NOT DETECTED
Opiates: POSITIVE — AB
Tetrahydrocannabinol: POSITIVE — AB

## 2021-09-14 LAB — URINALYSIS, ROUTINE W REFLEX MICROSCOPIC
Bilirubin Urine: NEGATIVE
Glucose, UA: NEGATIVE mg/dL
Hgb urine dipstick: NEGATIVE
Ketones, ur: NEGATIVE mg/dL
Leukocytes,Ua: NEGATIVE
Nitrite: NEGATIVE
Protein, ur: NEGATIVE mg/dL
Specific Gravity, Urine: >1.046 — ABNORMAL HIGH (ref 1.005–1.030)
pH: 5 (ref 5.0–8.0)

## 2021-09-14 LAB — COMPREHENSIVE METABOLIC PANEL
ALT: 10 U/L (ref 0–44)
AST: 17 U/L (ref 15–41)
Albumin: 3.9 g/dL (ref 3.5–5.0)
Alkaline Phosphatase: 82 U/L (ref 38–126)
Anion gap: 6 (ref 5–15)
BUN: 17 mg/dL (ref 6–20)
CO2: 23 mmol/L (ref 22–32)
Calcium: 9.1 mg/dL (ref 8.9–10.3)
Chloride: 108 mmol/L (ref 98–111)
Creatinine, Ser: 1.2 mg/dL (ref 0.61–1.24)
GFR, Estimated: >60 mL/min (ref >60)
Glucose, Bld: 107 mg/dL — ABNORMAL HIGH (ref 70–99)
Potassium: 4.1 mmol/L (ref 3.5–5.1)
Sodium: 137 mmol/L (ref 135–145)
Total Bilirubin: 0.4 mg/dL (ref 0.3–1.2)
Total Protein: 6.9 g/dL (ref 6.5–8.1)

## 2021-09-14 LAB — CBC
HCT: 43.2 % (ref 39.0–52.0)
Hemoglobin: 14.4 g/dL (ref 13.0–17.0)
MCH: 30.5 pg (ref 26.0–34.0)
MCHC: 33.3 g/dL (ref 30.0–36.0)
MCV: 91.5 fL (ref 80.0–100.0)
Platelets: 265 10*3/uL (ref 150–400)
RBC: 4.72 MIL/uL (ref 4.22–5.81)
RDW: 12.7 % (ref 11.5–15.5)
WBC: 6.9 10*3/uL (ref 4.0–10.5)
nRBC: 0 % (ref 0.0–0.2)

## 2021-09-14 LAB — LIPASE, BLOOD: Lipase: 71 U/L — ABNORMAL HIGH (ref 11–51)

## 2021-09-14 LAB — ETHANOL: Alcohol, Ethyl (B): <10 mg/dL (ref <10)

## 2021-09-14 MED ORDER — ALUM & MAG HYDROXIDE-SIMETH 200-200-20 MG/5ML PO SUSP
30.0000 mL | Freq: Once | ORAL | Status: AC
  Administered 2021-09-14: 30 mL via ORAL
  Filled 2021-09-14: qty 30

## 2021-09-14 MED ORDER — ONDANSETRON 4 MG PO TBDP: 4.0000 mg | ORAL_TABLET | Freq: Three times a day (TID) | ORAL | 0 refills | Status: DC | PRN

## 2021-09-14 MED ORDER — SUCRALFATE 1 G PO TABS: 1.0000 g | ORAL_TABLET | Freq: Three times a day (TID) | ORAL | 0 refills | Status: DC

## 2021-09-14 MED ORDER — PANTOPRAZOLE SODIUM 20 MG PO TBEC: 20.0000 mg | DELAYED_RELEASE_TABLET | Freq: Every day | ORAL | 0 refills | Status: DC

## 2021-09-14 MED ORDER — LIDOCAINE VISCOUS HCL 2 % MT SOLN
15.0000 mL | Freq: Once | OROMUCOSAL | Status: AC
  Administered 2021-09-14: 15 mL via ORAL
  Filled 2021-09-14: qty 15

## 2021-09-14 MED ORDER — MORPHINE SULFATE (PF) 4 MG/ML IV SOLN
4.0000 mg | Freq: Once | INTRAVENOUS | Status: AC
  Administered 2021-09-14: 4 mg via INTRAVENOUS
  Filled 2021-09-14: qty 1

## 2021-09-14 MED ORDER — IOHEXOL 300 MG/ML  SOLN
100.0000 mL | Freq: Once | INTRAMUSCULAR | Status: AC | PRN
  Administered 2021-09-14: 100 mL via INTRAVENOUS

## 2021-09-14 MED ORDER — ONDANSETRON HCL 4 MG/2ML IJ SOLN
4.0000 mg | Freq: Once | INTRAMUSCULAR | Status: AC
Start: 2021-09-14 — End: 2021-09-14
  Administered 2021-09-14: 4 mg via INTRAVENOUS
  Filled 2021-09-14: qty 2

## 2021-09-14 NOTE — ED Triage Notes (Signed)
Patient c/o mid abdominal pain and nausea since waking this AM. ?

## 2021-09-14 NOTE — Discharge Instructions (Addendum)
You came to the Emergency Department today to be evaluated for your abdominal pain, nausea, and vomiting.  The CT scan of your abdomen and pelvis did not show any acute abnormalities causing her symptoms.  Your lab results were reassuring. ? 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  Please follow-up with your primary care provider to manage these issues going forward. ? ?Your symptoms may be due to gastritis.  Due to this have been prescribed with Carafate and Protonix.  Please take this medication as prescribed. ? ?Get help right away if: ?Your pain does not go away as soon as your health care provider told you to expect. ?You cannot stop vomiting. ?Your pain is only in areas of the abdomen, such as the right side or the left lower portion of the abdomen. Pain on the right side could be caused by appendicitis. ?You have bloody or black stools, or stools that look like tar. ?You have severe pain, cramping, or bloating in your abdomen. ?You have signs of dehydration, such as: ?Dark urine, very little urine, or no urine. ?Cracked lips. ?Dry mouth. ?Sunken eyes. ?Sleepiness. ?Weakness. ?You have trouble breathing or chest pain. ? ? ?

## 2021-09-14 NOTE — ED Notes (Signed)
Patient resting quietly.  Reports improvement in pain.  Updated on plan of care ?

## 2021-09-14 NOTE — ED Notes (Signed)
Pt's wife given update via phone 

## 2021-09-14 NOTE — ED Provider Notes (Signed)
[facility]Provider Note ? ? ?CSN: [id number] ?Arrival date & time: 09/14/21  1751 ? ?  ? ?History ? ?Chief Complaint  ?Patient presents with  ? Abdominal Pain  ? Nausea  ? ? ?Cole Cannon is a 61 y.o. male with pertinent history of hypertension, CAD, ischemic cardiomyopathy, asthma, seizures, anxiety, bipolar disorder, schizophrenia.  Presents to the emergency department with a chief complaint of abdominal pain, nausea, and vomiting.  Patient states that abdominal pain started upon waking at 8 PM this morning.  Pain has been constant since then.  Pain is located to his epigastric area and does not radiate.  Patient describes pain as dull ache with intermittent sharp pains."  Patient rates pain 7/10 on the pain scale.  Patient denies any aggravating or alleviating factors.  Patient endorses nausea and vomiting.  States that he vomited once since this morning.  Patient describes emesis as stomach contents. ? ?Patient denies any history of abdominal surgery.  Patient endorses alcohol use.  States that he drinks 2 alcoholic beverages on average per week.  Patient states that yesterday he did have "half 1/5," of liquor.  Patient endorses frequent marijuana use.  Denies any other illicit drug use. ? ?Patient denies any fever, chills, abdominal distention, constipation, diarrhea, blood in stool, melena, dysuria, hematuria, urinary urgency, urinary frequency, swelling or tenderness to genitals, genital sores or lesions, penile discharge. ? ? ?Abdominal Pain ?Associated symptoms: nausea and vomiting   ?Associated symptoms: no chest pain, no chills, no constipation, no diarrhea, no dysuria, no fever, no hematuria and no shortness of breath   ? ?  ? ?Home Medications ?Prior to Admission medications   ?Medication Sig Start Date End Date Taking? Authorizing Provider  ?amLODipine (NORVASC) 10 MG tablet TAKE ONE TABLET BY MOUTH DAILY AT 9AM 08/28/21   Orion Crook I, NP  ?ARIPiprazole  (ABILIFY) 15 MG tablet Take 1 tablet (15 mg total) by mouth daily. 07/29/21 08/28/21  Lauro Franklin, MD  ?lisinopril (ZESTRIL) 10 MG tablet Take 1 tablet (10 mg total) by mouth daily. 07/29/21 08/28/21  Lauro Franklin, MD  ?metoprolol tartrate (LOPRESSOR) 50 MG tablet Take 1 tablet (50 mg total) by mouth 2 (two) times daily. 07/28/21 08/27/21  Lauro Franklin, MD  ?traZODone (DESYREL) 150 MG tablet Take 1 tablet (150 mg total) by mouth at bedtime. 07/28/21 08/27/21  Lauro Franklin, MD  ?   ? ?Allergies    ?Penicillins   ? ?Review of Systems   ?Review of Systems  ?Constitutional:  Negative for chills and fever.  ?Eyes:  Negative for visual disturbance.  ?Respiratory:  Negative for shortness of breath.   ?Cardiovascular:  Negative for chest pain.  ?Gastrointestinal:  Positive for abdominal pain, nausea and vomiting. Negative for abdominal distention, anal bleeding, blood in stool, constipation, diarrhea and rectal pain.  ?Genitourinary:  Negative for difficulty urinating, dysuria, flank pain, frequency, genital sores, hematuria, penile discharge, penile pain, penile swelling, scrotal swelling, testicular pain and urgency.  ?Musculoskeletal:  Negative for back pain and neck pain.  ?Skin:  Negative for color change and rash.  ?Neurological:  Negative for dizziness, syncope, light-headedness and headaches.  ?Psychiatric/Behavioral:  Negative for confusion.   ? ?Physical Exam ?Updated Vital Signs ?BP (!) 150/109 (BP Location: Right Arm)   Pulse (!) 58   Temp 98.2 ?F (36.8 ?C) (Oral)   Resp 18   Ht 5\' 10"  (1.778 m)   Wt 90.7 kg   SpO2 99%   BMI  28.70 kg/m?  ?Physical Exam ?Vitals and nursing note reviewed.  ?Constitutional:   ?   General: He is not in acute distress. ?   Appearance: He is not ill-appearing, toxic-appearing or diaphoretic.  ?HENT:  ?   Head: Normocephalic.  ?Eyes:  ?   General: No scleral icterus.    ?   Right eye: No discharge.     ?   Left eye: No discharge.  ?Cardiovascular:  ?    Rate and Rhythm: Normal rate.  ?Pulmonary:  ?   Effort: Pulmonary effort is normal. No tachypnea, bradypnea or respiratory distress.  ?   Breath sounds: Normal breath sounds. No stridor.  ?Abdominal:  ?   General: Abdomen is protuberant. Bowel sounds are normal. There is no distension. There are no signs of injury.  ?   Palpations: Abdomen is soft. There is no mass or pulsatile mass.  ?   Tenderness: There is abdominal tenderness in the epigastric area. There is no right CVA tenderness, left CVA tenderness, guarding or rebound.  ?   Hernia: There is no hernia in the umbilical area or ventral area.  ?Skin: ?   General: Skin is warm and dry.  ?Neurological:  ?   General: No focal deficit present.  ?   Mental Status: He is alert.  ?Psychiatric:     ?   Behavior: Behavior is cooperative.  ? ? ?ED Results / Procedures / Treatments   ?Labs ?(all labs ordered are listed, but only abnormal results are displayed) ?Labs Reviewed  ?LIPASE, BLOOD - Abnormal; Notable for the following components:  ?    Result Value  ? Lipase 71 (*)   ? All other components within normal limits  ?COMPREHENSIVE METABOLIC PANEL - Abnormal; Notable for the following components:  ? Glucose, Bld 107 (*)   ? All other components within normal limits  ?URINALYSIS, ROUTINE W REFLEX MICROSCOPIC - Abnormal; Notable for the following components:  ? Color, Urine STRAW (*)   ? Specific Gravity, Urine >1.046 (*)   ? All other components within normal limits  ?RAPID URINE DRUG SCREEN, HOSP PERFORMED - Abnormal; Notable for the following components:  ? Opiates POSITIVE (*)   ? Tetrahydrocannabinol POSITIVE (*)   ? All other components within normal limits  ?CBC  ?ETHANOL  ? ? ?EKG ?None ? ?Radiology ?CT ABDOMEN PELVIS W CONTRAST ? ?Result Date: 09/14/2021 ?CLINICAL DATA:  Epigastric abdominal pain, nausea EXAM: CT ABDOMEN AND PELVIS WITH CONTRAST TECHNIQUE: Multidetector CT imaging of the abdomen and pelvis was performed using the standard protocol following  bolus administration of intravenous contrast. RADIATION DOSE REDUCTION: This exam was performed according to the departmental dose-optimization program which includes automated exposure control, adjustment of the mA and/or kV according to patient size and/or use of iterative reconstruction technique. CONTRAST:  OMNIPAQUE IOHEXOL 300 MG/ML  SOLN COMPARISON:  None. FINDINGS: Lower chest: No acute abnormality. Trace fluid loculated within the left major fissure. Hepatobiliary: Innumerable cysts are seen throughout the liver. Additionally, a 3.2 x 4.4 cm benign cavernous hemangioma seen within the right hepatic lobe, axial image # 29/2. No intra or extrahepatic biliary ductal dilation. Gallbladder unremarkable. Pancreas: Unremarkable Spleen: Unremarkable Adrenals/Urinary Tract: The adrenal glands are unremarkable. Innumerable cysts are seen within the kidneys bilaterally in keeping with changes of polycystic kidney disease. No follow-up imaging is recommended for these lesions. No hydronephrosis. No enhancing intrarenal masses. No intrarenal or ureteral calculi. The bladder is unremarkable. Stomach/Bowel: The stomach, small bowel, and large bowel  are unremarkable. Appendix absent. No free intraperitoneal gas or fluid. Vascular/Lymphatic: Aortic atherosclerosis. No enlarged abdominal or pelvic lymph nodes. Reproductive: Prostate is unremarkable. Other: No abdominal wall hernia.  Rectum unremarkable. Musculoskeletal: Posttraumatic changes noted involving the left ilium. Degenerative changes seen within the lumbar spine. No acute bone abnormality. IMPRESSION: No acute intra-abdominal pathology identified. No definite radiographic explanation for the patient's reported symptoms. 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 Aortic Atherosclerosis (ICD10-I70.0). Electronically Signed   By: Helyn Numbers  M.D.   On: 09/14/2021 19:49   ? ?Procedures ?Procedures  ? ? ?Medications Ordered in ED ?Medications  ?morphine (PF) 4 MG/ML injection 4 mg (4 mg Intravenous Given 09/14/21 1857)  ?ondansetron (ZOFRAN) injection 4 mg (

## 2021-09-14 NOTE — ED Notes (Signed)
Patient currently in CT °

## 2021-09-25 ENCOUNTER — Other Ambulatory Visit: Payer: Self-pay | Admitting: Nurse Practitioner

## 2021-09-25 DIAGNOSIS — I10 Essential (primary) hypertension: Secondary | ICD-10-CM

## 2021-09-25 DIAGNOSIS — Z760 Encounter for issue of repeat prescription: Secondary | ICD-10-CM

## 2021-10-19 ENCOUNTER — Ambulatory Visit: Payer: Medicare Other

## 2021-10-24 ENCOUNTER — Other Ambulatory Visit: Payer: Self-pay | Admitting: Nurse Practitioner

## 2021-10-24 DIAGNOSIS — I10 Essential (primary) hypertension: Secondary | ICD-10-CM

## 2021-10-24 DIAGNOSIS — Z760 Encounter for issue of repeat prescription: Secondary | ICD-10-CM

## 2021-10-25 ENCOUNTER — Encounter (HOSPITAL_COMMUNITY): Payer: Self-pay | Admitting: Registered Nurse

## 2021-10-25 ENCOUNTER — Other Ambulatory Visit: Payer: Self-pay

## 2021-10-25 ENCOUNTER — Ambulatory Visit (HOSPITAL_COMMUNITY)
Admission: EM | Admit: 2021-10-25 | Discharge: 2021-10-26 | Disposition: A | Discharge status: Other Acute Inpatient Hospital | Payer: Medicare Other | Attending: Registered Nurse | Admitting: Registered Nurse

## 2021-10-25 DIAGNOSIS — F250 Schizoaffective disorder, bipolar type: Secondary | ICD-10-CM | POA: Insufficient documentation

## 2021-10-25 DIAGNOSIS — Z20822 Contact with and (suspected) exposure to covid-19: Secondary | ICD-10-CM | POA: Insufficient documentation

## 2021-10-25 DIAGNOSIS — F419 Anxiety disorder, unspecified: Secondary | ICD-10-CM | POA: Insufficient documentation

## 2021-10-25 DIAGNOSIS — R45851 Suicidal ideations: Secondary | ICD-10-CM | POA: Insufficient documentation

## 2021-10-25 DIAGNOSIS — F32A Depression, unspecified: Secondary | ICD-10-CM | POA: Insufficient documentation

## 2021-10-25 DIAGNOSIS — Z79899 Other long term (current) drug therapy: Secondary | ICD-10-CM | POA: Diagnosis not present

## 2021-10-25 LAB — COMPREHENSIVE METABOLIC PANEL
ALT: 17 U/L (ref 0–44)
AST: 19 U/L (ref 15–41)
Albumin: 4.6 g/dL (ref 3.5–5.0)
Alkaline Phosphatase: 94 U/L (ref 38–126)
Anion gap: 5 (ref 5–15)
BUN: 13 mg/dL (ref 6–20)
CO2: 26 mmol/L (ref 22–32)
Calcium: 10.1 mg/dL (ref 8.9–10.3)
Chloride: 107 mmol/L (ref 98–111)
Creatinine, Ser: 1.2 mg/dL (ref 0.61–1.24)
GFR, Estimated: >60 mL/min (ref >60)
Glucose, Bld: 105 mg/dL — ABNORMAL HIGH (ref 70–99)
Potassium: 4.7 mmol/L (ref 3.5–5.1)
Sodium: 138 mmol/L (ref 135–145)
Total Bilirubin: 0.5 mg/dL (ref 0.3–1.2)
Total Protein: 7.1 g/dL (ref 6.5–8.1)

## 2021-10-25 LAB — POC SARS CORONAVIRUS 2 AG: SARSCOV2ONAVIRUS 2 AG: NEGATIVE

## 2021-10-25 LAB — RESP PANEL BY RT-PCR (FLU A&B, COVID) ARPGX2
Influenza A by PCR: NEGATIVE
Influenza B by PCR: NEGATIVE
SARS Coronavirus 2 by RT PCR: NEGATIVE

## 2021-10-25 LAB — CBC WITH DIFFERENTIAL/PLATELET
Abs Immature Granulocytes: 0.05 10*3/uL (ref 0.00–0.07)
Basophils Absolute: 0.1 10*3/uL (ref 0.0–0.1)
Basophils Relative: 1 %
Eosinophils Absolute: 0.2 10*3/uL (ref 0.0–0.5)
Eosinophils Relative: 2 %
HCT: 49.5 % (ref 39.0–52.0)
Hemoglobin: 16.4 g/dL (ref 13.0–17.0)
Immature Granulocytes: 1 %
Lymphocytes Relative: 41 %
Lymphs Abs: 3.6 10*3/uL (ref 0.7–4.0)
MCH: 30.5 pg (ref 26.0–34.0)
MCHC: 33.1 g/dL (ref 30.0–36.0)
MCV: 92 fL (ref 80.0–100.0)
Monocytes Absolute: 0.5 10*3/uL (ref 0.1–1.0)
Monocytes Relative: 5 %
Neutro Abs: 4.5 10*3/uL (ref 1.7–7.7)
Neutrophils Relative %: 50 %
Platelets: 274 10*3/uL (ref 150–400)
RBC: 5.38 MIL/uL (ref 4.22–5.81)
RDW: 12.7 % (ref 11.5–15.5)
WBC: 8.8 10*3/uL (ref 4.0–10.5)
nRBC: 0 % (ref 0.0–0.2)

## 2021-10-25 LAB — POCT URINE DRUG SCREEN - MANUAL ENTRY (I-SCREEN)
POC Amphetamine UR: NOT DETECTED
POC Buprenorphine (BUP): NOT DETECTED
POC Cocaine UR: NOT DETECTED
POC Marijuana UR: POSITIVE — AB
POC Methadone UR: NOT DETECTED
POC Methamphetamine UR: NOT DETECTED
POC Morphine: NOT DETECTED
POC Oxazepam (BZO): NOT DETECTED
POC Oxycodone UR: NOT DETECTED
POC Secobarbital (BAR): NOT DETECTED

## 2021-10-25 LAB — ETHANOL: Alcohol, Ethyl (B): <10 mg/dL (ref <10)

## 2021-10-25 LAB — MAGNESIUM: Magnesium: 2 mg/dL (ref 1.7–2.4)

## 2021-10-25 MED ORDER — PANTOPRAZOLE SODIUM 20 MG PO TBEC
20.0000 mg | DELAYED_RELEASE_TABLET | Freq: Every day | ORAL | Status: DC
  Administered 2021-10-25 – 2021-10-26 (×2): 20 mg via ORAL
  Filled 2021-10-25 (×2): qty 1

## 2021-10-25 MED ORDER — TRAZODONE HCL 50 MG PO TABS: 50.0000 mg | ORAL_TABLET | Freq: Every evening | ORAL | Status: DC | PRN

## 2021-10-25 MED ORDER — MAGNESIUM HYDROXIDE 400 MG/5ML PO SUSP: 30.0000 mL | Freq: Every day | ORAL | Status: DC | PRN

## 2021-10-25 MED ORDER — ARIPIPRAZOLE 15 MG PO TABS
15.0000 mg | ORAL_TABLET | Freq: Every day | ORAL | Status: DC
  Administered 2021-10-25 – 2021-10-26 (×2): 15 mg via ORAL
  Filled 2021-10-25 (×2): qty 1

## 2021-10-25 MED ORDER — LISINOPRIL 20 MG PO TABS
20.0000 mg | ORAL_TABLET | Freq: Every day | ORAL | Status: DC
  Administered 2021-10-25 – 2021-10-26 (×2): 20 mg via ORAL
  Filled 2021-10-25 (×2): qty 1

## 2021-10-25 MED ORDER — METOPROLOL TARTRATE 50 MG PO TABS
100.0000 mg | ORAL_TABLET | Freq: Two times a day (BID) | ORAL | Status: DC
  Administered 2021-10-25 – 2021-10-26 (×2): 100 mg via ORAL
  Filled 2021-10-25 (×2): qty 2

## 2021-10-25 MED ORDER — TRAZODONE HCL 100 MG PO TABS
100.0000 mg | ORAL_TABLET | Freq: Every evening | ORAL | Status: DC | PRN
  Administered 2021-10-25: 100 mg via ORAL
  Filled 2021-10-25: qty 1

## 2021-10-25 MED ORDER — ALUM & MAG HYDROXIDE-SIMETH 200-200-20 MG/5ML PO SUSP: 30.0000 mL | ORAL | Status: DC | PRN

## 2021-10-25 MED ORDER — ACETAMINOPHEN 325 MG PO TABS: 650.0000 mg | ORAL_TABLET | Freq: Four times a day (QID) | ORAL | Status: DC | PRN

## 2021-10-25 MED ORDER — AMLODIPINE BESYLATE 10 MG PO TABS
10.0000 mg | ORAL_TABLET | Freq: Every day | ORAL | Status: DC
  Administered 2021-10-25 – 2021-10-26 (×2): 10 mg via ORAL
  Filled 2021-10-25 (×2): qty 1

## 2021-10-25 MED ORDER — HYDROXYZINE HCL 25 MG PO TABS
25.0000 mg | ORAL_TABLET | Freq: Three times a day (TID) | ORAL | Status: DC | PRN
  Administered 2021-10-25: 25 mg via ORAL
  Filled 2021-10-25: qty 1

## 2021-10-25 NOTE — ED Triage Notes (Signed)
Pt presents to [facility] voluntarily. Pt reports a hx of MDD, and schizoaffective. Pt states that he has been experiencing auditory hallucinations. Pt reports hearing a voice telling him to shoot his son in the face. Pt states he hears voices telling him that his son is sleeping with his ex-wife. Pt states the voices are uncontroallable and he fears that he may act on these feelings. Pt states that he does not have access to any weapons but states " I have no problem getting my hands on one". Pt reports crying spells. Pt denies SI. ?

## 2021-10-25 NOTE — ED Notes (Signed)
Pt A&O x 4, presents with suicidal ideations, plan to overdose on pills.  Pt admits to previous SI attempts.  Hx of depression.  He and wife have separated, lost his car and job.  Pt is depressed and tearful, unable to contract for safety.  Monitoring for safety. ?
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
## 2021-10-26 ENCOUNTER — Encounter: Payer: Self-pay | Admitting: Psychiatry

## 2021-10-26 ENCOUNTER — Inpatient Hospital Stay
Admission: AD | Admit: 2021-10-26 | Discharge: 2021-10-31 | DRG: 885 | Disposition: A | Discharge status: Home / Self Care | Payer: Medicare Other | Source: Intra-hospital | Attending: Psychiatry | Admitting: Psychiatry

## 2021-10-26 DIAGNOSIS — F32A Depression, unspecified: Secondary | ICD-10-CM | POA: Diagnosis not present

## 2021-10-26 DIAGNOSIS — F251 Schizoaffective disorder, depressive type: Secondary | ICD-10-CM | POA: Diagnosis not present

## 2021-10-26 DIAGNOSIS — Z91148 Patient's other noncompliance with medication regimen for other reason: Secondary | ICD-10-CM

## 2021-10-26 DIAGNOSIS — F259 Schizoaffective disorder, unspecified: Principal | ICD-10-CM | POA: Diagnosis present

## 2021-10-26 DIAGNOSIS — Z880 Allergy status to penicillin: Secondary | ICD-10-CM

## 2021-10-26 DIAGNOSIS — F17210 Nicotine dependence, cigarettes, uncomplicated: Secondary | ICD-10-CM | POA: Diagnosis present

## 2021-10-26 DIAGNOSIS — Z9151 Personal history of suicidal behavior: Secondary | ICD-10-CM

## 2021-10-26 DIAGNOSIS — E785 Hyperlipidemia, unspecified: Secondary | ICD-10-CM | POA: Diagnosis present

## 2021-10-26 DIAGNOSIS — I110 Hypertensive heart disease with heart failure: Secondary | ICD-10-CM | POA: Diagnosis present

## 2021-10-26 DIAGNOSIS — I252 Old myocardial infarction: Secondary | ICD-10-CM | POA: Diagnosis not present

## 2021-10-26 DIAGNOSIS — R45851 Suicidal ideations: Secondary | ICD-10-CM

## 2021-10-26 DIAGNOSIS — Z635 Disruption of family by separation and divorce: Secondary | ICD-10-CM | POA: Diagnosis not present

## 2021-10-26 DIAGNOSIS — K219 Gastro-esophageal reflux disease without esophagitis: Secondary | ICD-10-CM | POA: Diagnosis present

## 2021-10-26 DIAGNOSIS — I509 Heart failure, unspecified: Secondary | ICD-10-CM

## 2021-10-26 DIAGNOSIS — J45909 Unspecified asthma, uncomplicated: Secondary | ICD-10-CM | POA: Diagnosis present

## 2021-10-26 DIAGNOSIS — I2510 Atherosclerotic heart disease of native coronary artery without angina pectoris: Secondary | ICD-10-CM | POA: Diagnosis present

## 2021-10-26 DIAGNOSIS — F329 Major depressive disorder, single episode, unspecified: Secondary | ICD-10-CM | POA: Diagnosis present

## 2021-10-26 DIAGNOSIS — Z91018 Allergy to other foods: Secondary | ICD-10-CM

## 2021-10-26 DIAGNOSIS — F419 Anxiety disorder, unspecified: Secondary | ICD-10-CM | POA: Diagnosis present

## 2021-10-26 DIAGNOSIS — Z79899 Other long term (current) drug therapy: Secondary | ICD-10-CM

## 2021-10-26 DIAGNOSIS — Z20822 Contact with and (suspected) exposure to covid-19: Secondary | ICD-10-CM | POA: Diagnosis not present

## 2021-10-26 DIAGNOSIS — F250 Schizoaffective disorder, bipolar type: Secondary | ICD-10-CM | POA: Diagnosis not present

## 2021-10-26 DIAGNOSIS — G4700 Insomnia, unspecified: Secondary | ICD-10-CM | POA: Diagnosis present

## 2021-10-26 DIAGNOSIS — I10 Essential (primary) hypertension: Secondary | ICD-10-CM | POA: Diagnosis present

## 2021-10-26 MED ORDER — AMLODIPINE BESYLATE 5 MG PO TABS
10.0000 mg | ORAL_TABLET | Freq: Every day | ORAL | Status: DC
  Administered 2021-10-27 – 2021-10-31 (×5): 10 mg via ORAL
  Filled 2021-10-26 (×5): qty 2

## 2021-10-26 MED ORDER — TRAZODONE HCL 100 MG PO TABS
100.0000 mg | ORAL_TABLET | Freq: Every evening | ORAL | Status: DC | PRN
  Administered 2021-10-30: 100 mg via ORAL
  Filled 2021-10-26: qty 1

## 2021-10-26 MED ORDER — METOPROLOL TARTRATE 25 MG PO TABS
100.0000 mg | ORAL_TABLET | Freq: Two times a day (BID) | ORAL | Status: DC
  Administered 2021-10-26 – 2021-10-31 (×10): 100 mg via ORAL
  Filled 2021-10-26 (×10): qty 4

## 2021-10-26 MED ORDER — ALUM & MAG HYDROXIDE-SIMETH 200-200-20 MG/5ML PO SUSP: 30.0000 mL | ORAL | Status: DC | PRN

## 2021-10-26 MED ORDER — LISINOPRIL 20 MG PO TABS
20.0000 mg | ORAL_TABLET | Freq: Every day | ORAL | Status: DC
  Administered 2021-10-27 – 2021-10-31 (×5): 20 mg via ORAL
  Filled 2021-10-26 (×5): qty 1

## 2021-10-26 MED ORDER — PANTOPRAZOLE SODIUM 20 MG PO TBEC
20.0000 mg | DELAYED_RELEASE_TABLET | Freq: Every day | ORAL | Status: DC
  Administered 2021-10-27 – 2021-10-31 (×5): 20 mg via ORAL
  Filled 2021-10-26 (×6): qty 1

## 2021-10-26 MED ORDER — ARIPIPRAZOLE 5 MG PO TABS
15.0000 mg | ORAL_TABLET | Freq: Every day | ORAL | Status: DC
  Administered 2021-10-27 – 2021-10-31 (×5): 15 mg via ORAL
  Filled 2021-10-26 (×5): qty 1

## 2021-10-26 MED ORDER — ACETAMINOPHEN 325 MG PO TABS
650.0000 mg | ORAL_TABLET | Freq: Four times a day (QID) | ORAL | Status: DC | PRN
  Administered 2021-10-27: 650 mg via ORAL
  Filled 2021-10-26: qty 2

## 2021-10-26 MED ORDER — MAGNESIUM HYDROXIDE 400 MG/5ML PO SUSP: 30.0000 mL | Freq: Every day | ORAL | Status: DC | PRN

## 2021-10-26 NOTE — ED Provider Notes (Signed)
FBC/OBS ASAP Discharge Summary  Date and Time: 10/26/2021 2:32 PM  Name: Cole Cannon  MRN:  [id number]   Discharge Diagnoses:  Final diagnoses:  Schizoaffective disorder, bipolar type (HCC)  Suicidal ideation    Subjective: Patient seen and reevaluated face-to-face by this provider, and chart reviewed. On evaluation, patient is alert and oriented x4. His thought process is logical and speech is clear and coherent at a decreased tone. His mood is depressed and affect is congruent. Patient is noted to be tearful throughout the assessment. Patient endorses passive suicidal ideation "wish I was dead, it is hard to bounce back, tired of living, not wanting to be alive and burned down." Patient denies a suicide plan or intent.  Patient is unable to verbally contract for safety to discharge home. Patient denies homicidal ideations today and states that he recognizes that he was in his own head. He no longer verbalizes thoughts of wanting to kill his son. Patient endorses ongoing depressive symptoms of not being accepted, hopelessness, and sadness. Patient identifies life stressors as separating from his wife of 9 years in September 2022, she got him fired from his job and he lost his car. He states that he has been living with his daughter for the past couple of months. He states that he has had a hard time bouncing back. He denies AVH today. There is no objective evidence that the patient is currently responding to internal or external stimuli.  I spoke to the patient's daughter  Siris Hoos via telephone who confirms that the patient does currently live with her. She states that the patient ran out of his medications and started to get worse. She states that the patient has been complaining of hearing voices a lot and having suicidal thoughts.  Stay Summary: Cole Cannon, 61 y.o., male patient presents to [facility] as a walk in with complaints of worsening depression, anxiety, and auditory  hallucination. Labs obtained included an EKG, CBC, CMP, BAL, magnesium, COVID, UDS, and GC/chlamydia urine. Patient was restarted on Abilify 15 mg p.o., trazodone 100 mg p.o. nightly needed for sleep, Protonix 20 mg p.o., Norvasc 10 mg p.o., lisinopril 20 mg p.o., and Lopressor 100 mg p.o. Patient was recommended for inpatient psychiatric treatment on 10/26/21. Patient was accepted to [facility] adult unit for inpatient psychiatric treatment. Patient is voluntary.   Total Time spent with patient: 20 minutes  Past Psychiatric History: history of schizophrenia. Hospitalized at [facility] February 2023 and [facility] December 2022. Past Medical History:  Past Medical History:  Diagnosis Date   Asthma    Depression    Heart attack (HCC)    Hypertension    Seizures (HCC)    History reviewed. No pertinent surgical history. Family History:  Family History  Problem Relation Age of Onset   Cancer Sister    Cancer Brother    Family Psychiatric History: No family history reported.  Social History:  Social History   Substance and Sexual Activity  Alcohol Use Yes   Alcohol/week: 3.0 standard drinks   Types: 3 Shots of liquor per week     Social History   Substance and Sexual Activity  Drug Use Yes   Types: Marijuana    Social History   Socioeconomic History   Marital status: Legally Separated    Spouse name: Not on file   Number of children: Not on file   Years of education: Not on file   Highest education level: Not on file  Occupational History  Not on file  Tobacco Use   Smoking status: Every Day    Packs/day: 1.00    Types: Cigarettes   Smokeless tobacco: Never  Vaping Use   Vaping Use: Never used  Substance and Sexual Activity   Alcohol use: Yes    Alcohol/week: 3.0 standard drinks    Types: 3 Shots of liquor per week   Drug use: Yes    Types: Marijuana   Sexual activity: Not Currently  Other Topics Concern   Not on file  Social History Narrative   Not on file   Social  Determinants of Health   Financial Resource Strain: Not on file  Food Insecurity: Not on file  Transportation Needs: Not on file  Physical Activity: Not on file  Stress: Not on file  Social Connections: Not on file   SDOH:  SDOH Screenings   Alcohol Screen: Medium Risk   Last Alcohol Screening Score (AUDIT): 34  Depression (PHQ2-9): Medium Risk   PHQ-2 Score: 24  Financial Resource Strain: Not on file  Food Insecurity: Not on file  Housing: Not on file  Physical Activity: Not on file  Social Connections: Not on file  Stress: Not on file  Tobacco Use: High Risk   Smoking Tobacco Use: Every Day   Smokeless Tobacco Use: Never   Passive Exposure: Not on file  Transportation Needs: Not on file    Tobacco Cessation:  Prescription not provided because: declined  Current Medications:  Current Facility-Administered Medications  Medication Dose Route Frequency Provider Last Rate Last Admin   acetaminophen (TYLENOL) tablet 650 mg  650 mg Oral Q6H PRN Rankin, Shuvon B, NP       alum & mag hydroxide-simeth (MAALOX/MYLANTA) 200-200-20 MG/5ML suspension 30 mL  30 mL Oral Q4H PRN Rankin, Shuvon B, NP       amLODipine (NORVASC) tablet 10 mg  10 mg Oral Daily Rankin, Shuvon B, NP   10 mg at 10/26/21 0926   ARIPiprazole (ABILIFY) tablet 15 mg  15 mg Oral Daily Rankin, Shuvon B, NP   15 mg at 10/26/21 0926   hydrOXYzine (ATARAX) tablet 25 mg  25 mg Oral TID PRN Rankin, Shuvon B, NP   25 mg at 10/25/21 2029   lisinopril (ZESTRIL) tablet 20 mg  20 mg Oral Daily Rankin, Shuvon B, NP   20 mg at 10/26/21 0926   magnesium hydroxide (MILK OF MAGNESIA) suspension 30 mL  30 mL Oral Daily PRN Rankin, Shuvon B, NP       metoprolol tartrate (LOPRESSOR) tablet 100 mg  100 mg Oral BID Rankin, Shuvon B, NP   100 mg at 10/26/21 0926   pantoprazole (PROTONIX) EC tablet 20 mg  20 mg Oral Daily Rankin, Shuvon B, NP   20 mg at 10/26/21 0926   traZODone (DESYREL) tablet 100 mg  100 mg Oral QHS PRN Rankin, Shuvon  B, NP   100 mg at 10/25/21 2029   No current outpatient medications on file.    PTA Medications: (Not in a hospital admission)   Musculoskeletal  Strength & Muscle Tone: within normal limits Gait & Station: normal Patient leans: N/A  Psychiatric Specialty Exam  Presentation  General Appearance: Appropriate for Environment  Eye Contact:Fair  Speech:Clear and Coherent  Speech Volume:Normal  Handedness:Right   Mood and Affect  Mood:Depressed  Affect:Congruent   Thought Process  Thought Processes:Coherent  Descriptions of Associations:Circumstantial  Orientation:Full (Time, Place and Person)  Thought Content:Logical  Diagnosis of Schizophrenia or Schizoaffective disorder in past:  Yes  Duration of Psychotic Symptoms: Greater than six months   Hallucinations:Hallucinations: None Description of Auditory Hallucinations: Hearing voice telling him to shot his son; that his son is having an affair with his ex wife  Ideas of Reference:None  Suicidal Thoughts:Suicidal Thoughts: Yes, Passive SI Active Intent and/or Plan: Without Intent; Without Plan  Homicidal Thoughts:Homicidal Thoughts: No HI Passive Intent and/or Plan: Without Intent; Without Plan (Voices have told him to shoot his son.  Reports he has argued with son over the phone related to what the voices have said)   Sensorium  Memory:Immediate Fair; Recent Fair; Remote Fair  Judgment:Fair  Insight:Fair   Executive Functions  Concentration:Fair  Attention Span:Fair  Recall:Fair  Progress [employer] of Knowledge:Fair  Language:Fair   Psychomotor Activity  Psychomotor Activity:Psychomotor Activity: Normal   Assets  Assets:Communication Skills; Desire for Improvement; Financial Resources/Insurance; Housing; Physical Health   Sleep  Sleep:Sleep: Good   Nutritional Assessment (For OBS and FBC admissions only) Has the patient had a weight loss or gain of 10 pounds or more in the last 3 months?: No Has the  patient had a decrease in food intake/or appetite?: No Does the patient have dental problems?: No Does the patient have eating habits or behaviors that may be indicators of an eating disorder including binging or inducing vomiting?: No Has the patient recently lost weight without trying?: 0 Has the patient been eating poorly because of a decreased appetite?: 0 Malnutrition Screening Tool Score: 0    Physical Exam  Physical Exam HENT:     Head: Normocephalic.     Nose: Nose normal.  Cardiovascular:     Rate and Rhythm: Normal rate.  Pulmonary:     Effort: Pulmonary effort is normal.  Musculoskeletal:        General: Normal range of motion.     Cervical back: Normal range of motion.  Neurological:     Mental Status: He is alert and oriented to person, place, and time.   Review of Systems  Constitutional: Negative.   HENT: Negative.    Eyes: Negative.   Respiratory: Negative.    Cardiovascular: Negative.   Gastrointestinal: Negative.   Genitourinary: Negative.   Musculoskeletal: Negative.   Skin: Negative.   Neurological: Negative.   Endo/Heme/Allergies: Negative.   Blood pressure (!) 123/94, pulse 65, temperature 98.4 F (36.9 C), temperature source Oral, resp. rate 16, SpO2 100 %. There is no height or weight on file to calculate BMI.   Plan Of Care/Follow-up recommendations:  Activity:  as tolerated  Disposition: Patient accepted to [facility] for inpatient. Accepting provider is Dr. Toni Amend.   Layla Barter, NP 10/26/2021, 2:32 PM

## 2021-10-26 NOTE — Progress Notes (Signed)
BHH/BMU LCSW Progress Note   10/26/2021    2:25 PM  Cole Cannon   [id number]   Type of Contact and Topic:  Psychiatric Bed Placement   Pt accepted to [facility] Room 322     Patient meets inpatient criteria per [name], NP  The attending provider will be Dr. Toni Amend  Call report to [phone]  Milas Hock, RN @ [facility] notified.     Pt scheduled  to arrive at [facility] TODAY.   Damita Dunnings, MSW, LCSW-A  2:26 PM 10/26/2021

## 2021-10-26 NOTE — Discharge Instructions (Signed)
Transfer to [facility] 

## 2021-10-26 NOTE — Plan of Care (Signed)
New admission.  Problem: Education: Goal: Knowledge of [place] [facility] Education information/materials will improve Outcome: Not Progressing Goal: Emotional status will improve Outcome: Not Progressing Goal: Mental status will improve Outcome: Not Progressing Goal: Verbalization of understanding the information provided will improve Outcome: Not Progressing   Problem: Safety: Goal: Periods of time without injury will increase Outcome: Not Progressing   Problem: Coping: Goal: Coping ability will improve Outcome: Not Progressing Goal: Will verbalize feelings Outcome: Not Progressing   Problem: Safety: Goal: Ability to disclose and discuss suicidal ideas will improve Outcome: Not Progressing Goal: Ability to identify and utilize support systems that promote safety will improve Outcome: Not Progressing   Problem: Self-Concept: Goal: Level of anxiety will decrease Outcome: Not Progressing   Problem: Self-Concept: Goal: Ability to identify factors that promote anxiety will improve Outcome: Not Progressing Goal: Level of anxiety will decrease Outcome: Not Progressing Goal: Ability to modify response to factors that promote anxiety will improve Outcome: Not Progressing   Problem: Coping: Goal: Coping ability will improve Outcome: Not Progressing   Problem: Health Behavior/Discharge Planning: Goal: Identification of resources available to assist in meeting health care needs will improve Outcome: Not Progressing   Problem: Self-Concept: Goal: Will verbalize positive feelings about self Outcome: Not Progressing

## 2021-10-26 NOTE — ED Notes (Signed)
Pt stated he was still having SI w/o a plan. Denied HI and AVH. Pt questioned if he would be going home today. This nurse informed the Pt that most likely he would not because he is still suicidal and can not commit to leaving safely. Pt accepted morning meds w/o difficulty. Went back to his pull out chair/bed after accepting meds. Safety maintained and will continue to monitor.

## 2021-10-26 NOTE — Progress Notes (Signed)
Nursing report called to [facility]. Report given to [facility], Charity fundraiser. Patient to be transported via [employer] voluntarily.

## 2021-10-26 NOTE — Tx Team (Signed)
Initial Treatment Plan 10/26/2021 7:01 PM [name] Current HYQ:[id number]    PATIENT STRESSORS: Financial difficulties   Marital or family conflict   Medication change or noncompliance   Substance abuse     PATIENT STRENGTHS: Ability for insight  Printmaker for treatment/growth    PATIENT IDENTIFIED PROBLEMS: Hallucinations  Prior suicidal attempts  Depression  Anxiety  Separation from wife  Sleep disturbance           DISCHARGE CRITERIA:  Ability to meet basic life and health needs Improved stabilization in mood, thinking, and/or behavior Medical problems require only outpatient monitoring Need for constant or close observation no longer present Reduction of life-threatening or endangering symptoms to within safe limits  PRELIMINARY DISCHARGE PLAN: Outpatient therapy Placement in alternative living arrangements  PATIENT/FAMILY INVOLVEMENT: This treatment plan has been presented to and reviewed with the patient, Cole Cannon. The patient has been given the opportunity to ask questions and make suggestions.  Dona Walby, RN 10/26/2021, 7:01 PM

## 2021-10-26 NOTE — ED Notes (Addendum)
Consent faxed to [facility]

## 2021-10-26 NOTE — Progress Notes (Signed)
Inpatient Behavioral Health Placement  Pt meets inpatient criteria per [name], NP. Referral was sent to the following facilities;   Destination Service Provider Address Phone Fax Patient Preferred  [facility]  [address], [place] [address] [phone] --  [facility]  [address] [place] [address] [phone] --  [facility]  [address] [place] [address] [phone] --  [facility]  [address] [place], [place] [address] [phone] --  [facility]  [address], [place] [address] [phone] --  [facility]  [address] [place] [address] [phone] --  [facility]  [address] [place] [place] [address] [phone] --  [facility]  [address] [place] [address] [phone] --  [facility]  [address] [place] [address] [phone] --  [facility]  [address], [place] [address] [phone] --  [facility]  [address], [place] [address] [phone] --  [facility]  [address], [place] [address] [phone] --  [facility]  [address] [place] [address] [phone] --  [facility]  [address] [place], [place] [address] [phone] --  [facility]  [address] [place] [address] [phone]     Situation ongoing,  CSW will follow up.   Maryjean Ka, MSW, LCSWA 10/26/2021  @ 12:24 AM

## 2021-10-26 NOTE — ED Notes (Signed)
Pt sleeping at present, no distress noted, monitoring for safety. 

## 2021-10-26 NOTE — BH Assessment (Signed)
Patient has been accepted to [facility].  Accepting physician is Dr. Toni Amend.  Attending  Physician will be Dr. Toni Amend.  Patient has been assigned to room 322, by [facility] Charge Nurse Demetria.   Call report to [phone].  Representative/Transfer Coordinator is Warden/ranger Patient pre-admitted by [facility] Patient Access Sue Lush).

## 2021-10-26 NOTE — ED Notes (Signed)
Pt sleeping at present, no distress noted.  Monitoring for safety. 

## 2021-10-26 NOTE — Progress Notes (Signed)
Admission Note:   Report was received from [place], RN on a 61 year-old male who presents IVC in no acute distress for the treatment of SI command hallucinations, and Depression. Patient appears tearful and depressed. Patient was calm and cooperative with admission process. Patient presents with passive SI and contracts for safety upon admission. Patient endorses depression/anxiety stating that his  voices were getting louder, he's separated from his wife of nine years, he loss his job, home and car. With that being said he stated that he just wanted to die and get it all over with. Patient also stated that he doesn't feel like his medication was working. Patient's goals for treatment are to get his medications changed, get stabilized and possibly get into an assisted living facility because he is homeless and has been "living back and forth with family in [place] and [place]". Patient also stated that he would need assistance getting to his appointments and obtaining medication once discharged. Patient denies HI/AVH with this Clinical research associate. Patient also denied any physical pain, "just my feelings". Patient has a past medical of Asthma, HTN Depression and MI/Heart attack. Skin was assessed with Wendall Mola, RN and found to have healed cuts from razors across his back and sides, a mole on his lower right leg and left, outer arm at the elbow, a birthmark on his lower back, dry, flaky feet, a callus on the heel of his left foot, red spots on his anterior chest, from where he he had his arms resting across his chest, and a healed burn on the outer left leg. Patient searched and no contraband found and unit policies explained and understanding verbalized. Consents obtained. Food and fluids offered, and both accepted. Patient had no additional questions or concerns at this time. Patient remains safe on the unit.

## 2021-10-27 DIAGNOSIS — F251 Schizoaffective disorder, depressive type: Secondary | ICD-10-CM | POA: Diagnosis not present

## 2021-10-27 LAB — GC/CHLAMYDIA PROBE AMP (~~LOC~~) NOT AT ARMC
Chlamydia: NEGATIVE
Comment: NEGATIVE
Comment: NORMAL
Neisseria Gonorrhea: NEGATIVE

## 2021-10-27 NOTE — Plan of Care (Signed)
D- Patient alert and oriented. Patient presents in a depressed, but pleasant mood on assessment stating that he slept ok last night and had complaints of a headache. Patient rated his pain a "6/10", in which he did request PRN pain medication from this Clinical research associate. Although patient endorsed depression, anxiety and hopelessness on his self-inventory, he did state to this Clinical research associate that he is "feeling better today". Patient denies SI, HI, AVH at the time of assessment. Patient's goal for today is to "focus on today and let go of my past". In treatment team, patient reported that he loves being "sober and clean" and is concerned if he is truly going to get the help he needs with this hospitalization.  A- Scheduled medications administered to patient, per MD orders. Support and encouragement provided.  Routine safety checks conducted every 15 minutes.  Patient informed to notify staff with problems or concerns.  R- No adverse drug reactions noted. Patient contracts for safety at this time. Patient compliant with medications and treatment plan. Patient receptive, calm, and cooperative. Patient interacts well with others on the unit.  Patient remains safe at this time.  Problem: Education: Goal: Knowledge of [facility] Education information/materials will improve Outcome: Progressing Goal: Emotional status will improve Outcome: Progressing Goal: Mental status will improve Outcome: Progressing Goal: Verbalization of understanding the information provided will improve Outcome: Progressing   Problem: Safety: Goal: Periods of time without injury will increase Outcome: Progressing   Problem: Coping: Goal: Coping ability will improve Outcome: Progressing Goal: Will verbalize feelings Outcome: Progressing   Problem: Safety: Goal: Ability to disclose and discuss suicidal ideas will improve Outcome: Progressing Goal: Ability to identify and utilize support systems that promote safety will  improve Outcome: Progressing   Problem: Self-Concept: Goal: Level of anxiety will decrease Outcome: Progressing   Problem: Self-Concept: Goal: Ability to identify factors that promote anxiety will improve Outcome: Progressing Goal: Level of anxiety will decrease Outcome: Progressing Goal: Ability to modify response to factors that promote anxiety will improve Outcome: Progressing   Problem: Coping: Goal: Coping ability will improve Outcome: Progressing   Problem: Health Behavior/Discharge Planning: Goal: Identification of resources available to assist in meeting health care needs will improve Outcome: Progressing   Problem: Self-Concept: Goal: Will verbalize positive feelings about self Outcome: Progressing   Problem: Education: Goal: Will be free of psychotic symptoms Outcome: Progressing Goal: Knowledge of the prescribed therapeutic regimen will improve Outcome: Progressing

## 2021-10-27 NOTE — Progress Notes (Signed)
Patient is quietly sitting in his room, on his bed. Patient had no complaints or concerns to voice to this Clinical research associate. Patient remains safe on the unit.

## 2021-10-27 NOTE — BHH Suicide Risk Assessment (Signed)
BHH INPATIENT:  Family/Significant Other Suicide Prevention Education  Suicide Prevention Education:  Education Completed; Akiem Urieta, daughter, [phone]  has been identified by the patient as the family member/significant other with whom the patient will be residing, and identified as the person(s) who will aid the patient in the event of a mental health crisis (suicidal ideations/suicide attempt).  With written consent from the patient, the family member/significant other has been provided the following suicide prevention education, prior to the and/or following the discharge of the patient.  The suicide prevention education provided includes the following: Suicide risk factors Suicide prevention and interventions National Suicide Hotline telephone number [facility] assessment telephone number [facility] Emergency Assistance 911 [facility] and/or Residential Mobile Crisis Unit telephone number  Request made of family/significant other to: Remove weapons (e.g., guns, rifles, knives), all items previously/currently identified as safety concern.   Remove drugs/medications (over-the-counter, prescriptions, illicit drugs), all items previously/currently identified as a safety concern.  The family member/significant other verbalizes understanding of the suicide prevention education information provided.  The family member/significant other agrees to remove the items of safety concern listed above.  Daughter reports that "he's battling going through this divorce".  She reports "he's been staying with me for a few months". She reports "he gets a job but then he battles depression and he wont even work or eat".  She reports that patient has been having crying spells.  She reports that "when he is like that he can be a danger to others".  She reports that she has assisted patient in obtaining housing applications but she is unsure of what he has done with the applications".   She reports that she is struggling as well.  She reports that is facing eviction as well.  [place] Mo 10/27/2021, 3:18 PM

## 2021-10-27 NOTE — [facility] Suicide Risk Assessment (Signed)
[facility] INPATIENT:  Family/Significant Other Suicide Prevention Education  Suicide Prevention Education:  Contact Attempts: Cole Cannon, daughter, [phone] has been identified by the patient as the family member/significant other with whom the patient will be residing, and identified as the person(s) who will aid the patient in the event of a mental health crisis.  With written consent from the patient, two attempts were made to provide suicide prevention education, prior to and/or following the patient's discharge.  We were unsuccessful in providing suicide prevention education.  A suicide education pamphlet was given to the patient to share with family/significant other.  Date and time of first attempt:10/27/2021 at 2:43PM Date and time of second attempt: Second attempt is needed.  CSW left HIPAA compliant voicemail.  Cole Cannon 10/27/2021, 2:43 PM

## 2021-10-27 NOTE — [facility] IP Treatment Plan (Signed)
Interdisciplinary Treatment and Diagnostic Plan Update  10/27/2021 Time of Session: 9:30 AM Cole Cannon MRN: [id number]  Principal Diagnosis: Schizoaffective disorder [facility])  Secondary Diagnoses: Principal Problem:   Schizoaffective disorder ([place])   Current Medications:  Current Facility-Administered Medications  Medication Dose Route Frequency Provider Last Rate Last Admin   acetaminophen (TYLENOL) tablet 650 mg  650 mg Oral Q6H PRN Clapacs, John T, MD   650 mg at 10/27/21 0827   alum & mag hydroxide-simeth (MAALOX/MYLANTA) 200-200-20 MG/5ML suspension 30 mL  30 mL Oral Q4H PRN Clapacs, John T, MD       amLODipine (NORVASC) tablet 10 mg  10 mg Oral Daily Clapacs, Madie Reno, MD   10 mg at 10/27/21 9622   ARIPiprazole (ABILIFY) tablet 15 mg  15 mg Oral Daily Clapacs, Madie Reno, MD   15 mg at 10/27/21 0826   lisinopril (ZESTRIL) tablet 20 mg  20 mg Oral Daily Clapacs, John T, MD   20 mg at 10/27/21 2979   magnesium hydroxide (MILK OF MAGNESIA) suspension 30 mL  30 mL Oral Daily PRN Clapacs, Madie Reno, MD       metoprolol tartrate (LOPRESSOR) tablet 100 mg  100 mg Oral BID Clapacs, John T, MD   100 mg at 10/27/21 0827   pantoprazole (PROTONIX) EC tablet 20 mg  20 mg Oral Daily Clapacs, John T, MD   20 mg at 10/27/21 0827   traZODone (DESYREL) tablet 100 mg  100 mg Oral QHS PRN Clapacs, Madie Reno, MD       PTA Medications: Medications Prior to Admission  Medication Sig Dispense Refill Last Dose   amLODipine (NORVASC) 10 MG tablet Take 10 mg by mouth daily.   10/26/2021 at 0900   carbamazepine (TEGRETOL) 200 MG tablet Take 200 mg by mouth in the morning and at bedtime.   10/26/2021 at 0900   citalopram (CELEXA) 20 MG tablet Take 20 mg by mouth daily.   10/26/2021 at 0900   lisinopril (ZESTRIL) 20 MG tablet Take 20 mg by mouth daily.   10/26/2021 at 0900   metoprolol tartrate (LOPRESSOR) 100 MG tablet Take 100 mg by mouth 2 (two) times daily.   10/26/2021 at 0900   pantoprazole (PROTONIX) 20 MG tablet  Take 20 mg by mouth daily.   10/26/2021 at 0900    Patient Stressors: Financial difficulties   Marital or family conflict   Medication change or noncompliance   Substance abuse    Patient Strengths: Ability for Health and safety inspector for treatment/growth   Treatment Modalities: Medication Management, Group therapy, Case management,  1 to 1 session with clinician, Psychoeducation, Recreational therapy.   Physician Treatment Plan for Primary Diagnosis: Schizoaffective disorder ([place]) Long Term Goal(s):     Short Term Goals:    Medication Management: Evaluate patient's response, side effects, and tolerance of medication regimen.  Therapeutic Interventions: 1 to 1 sessions, Unit Group sessions and Medication administration.  Evaluation of Outcomes: Not Met  Physician Treatment Plan for Secondary Diagnosis: Principal Problem:   Schizoaffective disorder ([place])  Long Term Goal(s):     Short Term Goals:       Medication Management: Evaluate patient's response, side effects, and tolerance of medication regimen.  Therapeutic Interventions: 1 to 1 sessions, Unit Group sessions and Medication administration.  Evaluation of Outcomes: Not Met   RN Treatment Plan for Primary Diagnosis: Schizoaffective disorder ([place]) Long Term Goal(s): Knowledge of disease and therapeutic regimen to maintain health will improve  Short Term Goals:  Ability to demonstrate self-control, Ability to participate in decision making will improve, Ability to verbalize feelings will improve, Ability to disclose and discuss suicidal ideas, Ability to identify and develop effective coping behaviors will improve, and Compliance with prescribed medications will improve  Medication Management: RN will administer medications as ordered by provider, will assess and evaluate patient's response and provide education to patient for prescribed medication. RN will report any adverse and/or side effects to  prescribing provider.  Therapeutic Interventions: 1 on 1 counseling sessions, Psychoeducation, Medication administration, Evaluate responses to treatment, Monitor vital signs and CBGs as ordered, Perform/monitor CIWA, COWS, AIMS and Fall Risk screenings as ordered, Perform wound care treatments as ordered.  Evaluation of Outcomes: Not Met   LCSW Treatment Plan for Primary Diagnosis: Schizoaffective disorder ([place]) Long Term Goal(s): Safe transition to appropriate next level of care at discharge, Engage patient in therapeutic group addressing interpersonal concerns.  Short Term Goals: Engage patient in aftercare planning with referrals and resources, Increase social support, Increase ability to appropriately verbalize feelings, Increase emotional regulation, Facilitate acceptance of mental health diagnosis and concerns, and Increase skills for wellness and recovery  Therapeutic Interventions: Assess for all discharge needs, 1 to 1 time with Social worker, Explore available resources and support systems, Assess for adequacy in community support network, Educate family and significant other(s) on suicide prevention, Complete Psychosocial Assessment, Interpersonal group therapy.  Evaluation of Outcomes: Not Met   Progress in Treatment: Attending groups: No. Participating in groups: No. Taking medication as prescribed: Yes. Toleration medication: Yes. Family/Significant other contact made: No, will contact:  once permission is given. Patient understands diagnosis: Yes. Discussing patient identified problems/goals with staff: Yes. Medical problems stabilized or resolved: Yes. Denies suicidal/homicidal ideation: Yes. Issues/concerns per patient self-inventory: No. Other: none  New problem(s) identified: No, Describe:  none  New Short Term/Long Term Goal(s): elimination of symptoms of psychosis, medication management for mood stabilization; elimination of SI thoughts; development of  comprehensive mental wellness/sobriety plan.   Patient Goals:  "get the help that I need"  Discharge Plan or Barriers:  CSW will assist patient in developing appropriate discharge plans.   Reason for Continuation of Hospitalization: Anxiety Depression Medication stabilization Suicidal ideation  Estimated Length of Stay:  1-7 days  Last 3 [place] Suicide Severity Risk Score: [place] Admission (Current) from 10/26/2021 in [facility] ED from 10/25/2021 in [facility] ED from 09/14/2021 in [place] DEPT  C-SSRS RISK CATEGORY Low Risk High Risk No Risk       Last PHQ 2/9 Scores:    10/25/2021    6:38 PM 10/25/2021    6:37 PM 07/21/2021    4:16 PM  Depression screen PHQ 2/9  Decreased Interest _0 Down, Depressed, Hopeless _1 PHQ - 2 Score _2 Altered sleeping _3 Tired, decreased energy _4 Change in appetite 2 0 1  Feeling bad or failure about yourself  _5 Trouble concentrating _6 Moving slowly or fidgety/restless 2 0 1  Suicidal thoughts _7 PHQ-9 Score _8 Difficult doing work/chores Very difficult Very difficult Somewhat difficult    Scribe for Treatment Team: Rozann Lesches, LCSW 10/27/2021 10:39 AM

## 2021-10-27 NOTE — Group Note (Signed)
[facility] LCSW Group Therapy Note   Group Date: 10/27/2021 Start Time: 1300 End Time: 1400  Type of Therapy and Topic:  Group Therapy:  Feelings around Relapse and Recovery  Participation Level:  Active   Mood: Euthymic   Description of Group:    Patients in this group will discuss emotions they experience before and after a relapse. They will process how experiencing these feelings, or avoidance of experiencing them, relates to having a relapse. Facilitator will guide patients to explore emotions they have related to recovery. Patients will be encouraged to process which emotions are more powerful. They will be guided to discuss the emotional reaction significant others in their lives may have to patients' relapse or recovery. Patients will be assisted in exploring ways to respond to the emotions of others without this contributing to a relapse.  Therapeutic Goals: Patient will identify two or more emotions that lead to relapse for them:  Patient will identify two emotions that result when they relapse:  Patient will identify two emotions related to recovery:  Patient will demonstrate ability to communicate their needs through discussion and/or role plays.   Summary of Patient Progress:  Patient was present for the entirety of the group session. Patient was an active listener and participated in the topic of discussion, provided helpful advice to others, and added nuance to topic of conversation. Patient was appropriate in group, states he has a tendency to return to toxic relationships. He often copes by drinking alcohol. Others in the group provided helpful advice.   Therapeutic Modalities:   Cognitive Behavioral Therapy Solution-Focused Therapy Assertiveness Training Relapse Prevention Therapy   Corky Crafts, [place]

## 2021-10-27 NOTE — Progress Notes (Signed)
Patient calm and pleasant during assessment. Pt denies SI/HI/AVH. Pt observed interacting appropriately with staff and peers on the unit. Pt didn't have any medications scheduled tonight and hasn't requested anything PRN. Pt being monitored Q 15 minutes for safety per unit protocol. Pt remains safe on the unit.
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
## 2021-10-27 NOTE — Progress Notes (Signed)
Patient calm and pleasant during assessment. Pt endorses passive SI, verbally contracts for safety. Pt denies HI/AVH. Pt observed interacting appropriately with staff and peers on the unit. Pt didn't have any medications scheduled tonight and hasn't requested anything PRN. Pt being monitored Q 15 minutes for safety per unit protocol. Pt remains safe on the unit.

## 2021-10-28 DIAGNOSIS — F251 Schizoaffective disorder, depressive type: Secondary | ICD-10-CM | POA: Diagnosis not present

## 2021-10-28 LAB — LIPID PANEL
Cholesterol: 197 mg/dL (ref 0–200)
HDL: 35 mg/dL — ABNORMAL LOW (ref >40)
LDL Cholesterol: 135 mg/dL — ABNORMAL HIGH (ref 0–99)
Total CHOL/HDL Ratio: 5.6 RATIO
Triglycerides: 135 mg/dL (ref <150)
VLDL: 27 mg/dL (ref 0–40)

## 2021-10-28 LAB — HEMOGLOBIN A1C
Hgb A1c MFr Bld: 6.3 % — ABNORMAL HIGH (ref 4.8–5.6)
Mean Plasma Glucose: 134.11 mg/dL

## 2021-10-28 LAB — TSH: TSH: 3.64 u[IU]/mL (ref 0.350–4.500)

## 2021-10-28 MED ORDER — TRAZODONE HCL 100 MG PO TABS
100.0000 mg | ORAL_TABLET | Freq: Every day | ORAL | Status: DC
Start: 2021-10-28 — End: 2021-10-30
  Administered 2021-10-28 – 2021-10-29 (×2): 100 mg via ORAL
  Filled 2021-10-28 (×2): qty 1

## 2021-10-28 NOTE — Progress Notes (Signed)
Pt states that today "I feel a lot better". He has been calm and cooperative. He said "it was a great group" today.He took a shower and washed his clothes. He has been med compliant. Collier Bullock RN

## 2021-10-28 NOTE — Plan of Care (Signed)
Pt rates depression, anxiety and hopelessness all 8/10. Pt denies SI, HI and AVH. Pt was educated on care plan and verbalizes understanding. Collier Bullock RN  Problem: Education: Goal: Knowledge of [facility] Education information/materials will improve Outcome: Progressing Goal: Emotional status will improve Outcome: Progressing Goal: Mental status will improve Outcome: Progressing Goal: Verbalization of understanding the information provided will improve Outcome: Progressing   Problem: Safety: Goal: Periods of time without injury will increase Outcome: Progressing   Problem: Coping: Goal: Coping ability will improve Outcome: Progressing Goal: Will verbalize feelings Outcome: Progressing   Problem: Safety: Goal: Ability to disclose and discuss suicidal ideas will improve Outcome: Progressing Goal: Ability to identify and utilize support systems that promote safety will improve Outcome: Progressing   Problem: Self-Concept: Goal: Level of anxiety will decrease Outcome: Progressing   Problem: Self-Concept: Goal: Ability to identify factors that promote anxiety will improve Outcome: Progressing Goal: Level of anxiety will decrease Outcome: Progressing Goal: Ability to modify response to factors that promote anxiety will improve Outcome: Progressing   Problem: Coping: Goal: Coping ability will improve Outcome: Progressing   Problem: Health Behavior/Discharge Planning: Goal: Identification of resources available to assist in meeting health care needs will improve Outcome: Progressing   Problem: Self-Concept: Goal: Will verbalize positive feelings about self Outcome: Progressing   Problem: Education: Goal: Will be free of psychotic symptoms Outcome: Progressing Goal: Knowledge of the prescribed therapeutic regimen will improve Outcome: Progressing

## 2021-10-28 NOTE — Progress Notes (Signed)
[facility] MD Progress Note  10/28/2021 2:26 PM Cole Cannon  MRN:  [id number] Subjective:  pt seen and chart reviewed.  Cole Cannon reports feeling better, but complained that Cole Cannon can't sleep.  Agreed to add trazodone for sleep tonight. Otherwise, no complaints.  Denied SI or HI. No AVH.   Principal Problem: Schizoaffective disorder (HCC) Diagnosis: Principal Problem:   Schizoaffective disorder (HCC) Active Problems:   Congestive heart failure (HCC)   CAD (coronary artery disease)   Dyslipidemia   HTN (hypertension)  Total Time spent with patient: 30 minutes  Past Psychiatric History:  Patient has a history of schizoaffective disorder many prior hospitalizations most recently earlier this year.  Cole Cannon was on Abilify at that time and said that it was helping.  Cole Cannon does have a history of suicide attempts.  Unclear if Cole Cannon has had much of a past history of violence.  Past Medical History:  Past Medical History:  Diagnosis Date   Asthma    Depression    Heart attack (HCC)    Hypertension    Seizures (HCC)    History reviewed. No pertinent surgical history. Family History:  Family History  Problem Relation Age of Onset   Cancer Sister    Cancer Brother    Family Psychiatric  History: none Social History:  Social History   Substance and Sexual Activity  Alcohol Use Yes   Alcohol/week: 3.0 standard drinks   Types: 3 Shots of liquor per week     Social History   Substance and Sexual Activity  Drug Use Yes   Types: Marijuana    Social History   Socioeconomic History   Marital status: Legally Separated    Spouse name: Not on file   Number of children: Not on file   Years of education: Not on file   Highest education level: Not on file  Occupational History   Not on file  Tobacco Use   Smoking status: Every Day    Packs/day: 1.00    Types: Cigarettes   Smokeless tobacco: Never  Vaping Use   Vaping Use: Never used  Substance and Sexual Activity   Alcohol use: Yes    Alcohol/week:  3.0 standard drinks    Types: 3 Shots of liquor per week   Drug use: Yes    Types: Marijuana   Sexual activity: Not Currently  Other Topics Concern   Not on file  Social History Narrative   Not on file   Social Determinants of Health   Financial Resource Strain: Not on file  Food Insecurity: Not on file  Transportation Needs: Not on file  Physical Activity: Not on file  Stress: Not on file  Social Connections: Not on file   Additional Social History:     Sleep: Poor  Appetite:  Fair  Current Medications: Current Facility-Administered Medications  Medication Dose Route Frequency Provider Last Rate Last Admin   acetaminophen (TYLENOL) tablet 650 mg  650 mg Oral Q6H PRN Clapacs, John T, MD   650 mg at 10/27/21 0827   alum & mag hydroxide-simeth (MAALOX/MYLANTA) 200-200-20 MG/5ML suspension 30 mL  30 mL Oral Q4H PRN Clapacs, John T, MD       amLODipine (NORVASC) tablet 10 mg  10 mg Oral Daily Clapacs, John T, MD   10 mg at 10/28/21 4097   ARIPiprazole (ABILIFY) tablet 15 mg  15 mg Oral Daily Clapacs, John T, MD   15 mg at 10/28/21 0733   lisinopril (ZESTRIL) tablet 20 mg  20 mg  Oral Daily Clapacs, Cole Denmark, MD   20 mg at 10/28/21 6503   magnesium hydroxide (MILK OF MAGNESIA) suspension 30 mL  30 mL Oral Daily PRN Clapacs, Cole Denmark, MD       metoprolol tartrate (LOPRESSOR) tablet 100 mg  100 mg Oral BID Clapacs, Cole Denmark, MD   100 mg at 10/28/21 0734   pantoprazole (PROTONIX) EC tablet 20 mg  20 mg Oral Daily Clapacs, Cole Denmark, MD   20 mg at 10/28/21 0734   traZODone (DESYREL) tablet 100 mg  100 mg Oral QHS PRN Clapacs, Cole Denmark, MD        Lab Results:  Results for orders placed or performed during the hospital encounter of 10/26/21 (from the past 48 hour(s))  Hemoglobin A1c     Status: Abnormal   Collection Time: 10/28/21  5:56 AM  Result Value Ref Range   Hgb A1c MFr Bld 6.3 (H) 4.8 - 5.6 %    Comment: (NOTE) Pre diabetes:          5.7%-6.4%  Diabetes:               >6.4%  Glycemic control for   <7.0% adults with diabetes    Mean Plasma Glucose 134.11 mg/dL    Comment: Performed at [facility], [address] [place], [place] [address]  Lipid panel     Status: Abnormal   Collection Time: 10/28/21  5:56 AM  Result Value Ref Range   Cholesterol 197 0 - 200 mg/dL   Triglycerides 812 <751 mg/dL   HDL 35 (L) >70 mg/dL   Total CHOL/HDL Ratio 5.6 RATIO   VLDL 27 0 - 40 mg/dL   LDL Cholesterol 017 (H) 0 - 99 mg/dL    Comment:        Total Cholesterol/HDL:CHD Risk Coronary Heart Disease Risk Table                     Men   Women  1/2 Average Risk   3.4   3.3  Average Risk       5.0   4.4  2 X Average Risk   9.6   7.1  3 X Average Risk  23.4   11.0        Use the calculated Patient Ratio above and the CHD Risk Table to determine the patient's CHD Risk.        ATP III CLASSIFICATION (LDL):  <100     mg/dL   Optimal  494-496  mg/dL   Near or Above                    Optimal  130-159  mg/dL   Borderline  759-163  mg/dL   High  >846     mg/dL   Very High Performed at [facility], [address] [place], [place] [address]   TSH     Status: None   Collection Time: 10/28/21  5:56 AM  Result Value Ref Range   TSH 3.640 0.350 - 4.500 uIU/mL    Comment: Performed by a 3rd Generation assay with a functional sensitivity of <=0.01 uIU/mL. Performed at [facility], [address] [place], [place] [address]     Blood Alcohol level:  Lab Results  Component Value Date   [facility] <10 10/25/2021   ETH <10 09/14/2021    Metabolic Disorder Labs: Lab Results  Component Value Date   HGBA1C 6.3 (H) 10/28/2021  MPG 134.11 10/28/2021   MPG 119.76 07/25/2021   No results found for: PROLACTIN Lab Results  Component Value Date   CHOL 197 10/28/2021   TRIG 135 10/28/2021   HDL 35 (L) 10/28/2021   CHOLHDL 5.6 10/28/2021   VLDL 27 10/28/2021   LDLCALC 135 (H) 10/28/2021   LDLCALC 101 (H) 07/22/2021    Physical  Findings: AIMS:  , ,  ,  ,    CIWA:    COWS:     Musculoskeletal: Strength & Muscle Tone: within normal limits Gait & Station: normal Patient leans: N/A  Psychiatric Specialty Exam:  Presentation  General Appearance: Appropriate for Environment  Eye Contact:Fair  Speech:Clear and Coherent  Speech Volume:Normal  Handedness:Right   Mood and Affect  Mood:Depressed  Affect:Congruent   Thought Process  Thought Processes:Coherent  Descriptions of Associations:Circumstantial  Orientation:Full (Time, Place and Person)  Thought Content:Logical  History of Schizophrenia/Schizoaffective disorder:Yes  Duration of Psychotic Symptoms:Greater than six months  Hallucinations:No data recorded Ideas of Reference:None  Suicidal Thoughts:No data recorded Homicidal Thoughts:No data recorded  Sensorium  Memory:Immediate Fair; Recent Fair; Remote Fair  Judgment:Fair  Insight:Fair   Executive Functions  Concentration:Fair  Attention Span:Fair  Recall:Fair  Fund of Knowledge:Fair  Language:Fair   Psychomotor Activity  Psychomotor Activity:No data recorded  Assets  Assets:Communication Skills; Desire for Improvement; Financial Resources/Insurance; Housing; Physical Health   Sleep  Sleep:No data recorded   Physical Exam: Physical Exam Psychiatric:        Attention and Perception: Attention normal.        Mood and Affect: Mood is anxious and depressed. Affect is blunt.        Speech: Speech normal.        Behavior: Behavior normal.        Thought Content: Thought content normal.        Cognition and Memory: Cognition normal.        Judgment: Judgment normal.   ROS Blood pressure (!) 139/106, pulse 71, temperature 97.8 F (36.6 C), temperature source Oral, resp. rate 18, height 5\' 10"  (1.778 m), weight 89.4 kg, SpO2 100 %. Body mass index is 28.27 kg/m.   Treatment Plan Summary: Daily contact with patient to assess and evaluate symptoms and  progress in treatment and Medication management  Schizoaffective disorder -- continue Abilify 15mg  daily.  -- add trazodone 100mg  qhs.    Felise Georgia, MD 10/28/2021, 2:26 PM

## 2021-10-28 NOTE — Progress Notes (Signed)
Patient calm and pleasant during assessment. Pt denies SI/HI/AVH. Pt observed interacting appropriately with staff and peers on the unit. Pt compliant with medication administration per MD orders. Pt being monitored Q 15 minutes for safety per unit protocol. Pt remains safe on the unit.

## 2021-10-29 DIAGNOSIS — F251 Schizoaffective disorder, depressive type: Secondary | ICD-10-CM | POA: Diagnosis not present

## 2021-10-29 NOTE — Progress Notes (Signed)
Patient is outside, in the courtyard, playing basketball with staff and other members on the unit. Patient remains safe at this time.

## 2021-10-29 NOTE — Progress Notes (Signed)
Pt visible on the unit interacting with peers and staff. He denies SI/HI and AVH. He denies having symptoms anxiety and depression. Pt reports he is here, because he was having delusional had thoughts of killing his son. He thought his son was in an relationship with his wife. He is separated from his wife. He came to the hospital to get some help controlling these thoughts. His goal is to get better and to go home, plus he is looking for a place to live.

## 2021-10-29 NOTE — Progress Notes (Signed)
[facility] MD Progress Note  10/29/2021 2:23 PM [name]  MRN:  [id number] Subjective:  pt seen and chart reviewed.  Cole Cannon reports sleeping well with Trazodone,  tolerates meds, no other complaints.   Denied SI or HI. No AVH.   Principal Problem: Schizoaffective disorder (HCC) Diagnosis: Principal Problem:   Schizoaffective disorder (HCC) Active Problems:   Congestive heart failure (HCC)   CAD (coronary artery disease)   Dyslipidemia   HTN (hypertension)  Total Time spent with patient: 30 minutes  Past Psychiatric History:  Patient has a history of schizoaffective disorder many prior hospitalizations most recently earlier this year.  [name] was on Abilify at that time and said that it was helping.  [name] does have a history of suicide attempts.  Unclear if [name] has had much of a past history of violence.  Past Medical History:  Past Medical History:  Diagnosis Date   Asthma    Depression    Heart attack (HCC)    Hypertension    Seizures (HCC)    History reviewed. No pertinent surgical history. Family History:  Family History  Problem Relation Age of Onset   Cancer Sister    Cancer Brother    Family Psychiatric  History: none Social History:  Social History   Substance and Sexual Activity  Alcohol Use Yes   Alcohol/week: 3.0 standard drinks   Types: 3 Shots of liquor per week     Social History   Substance and Sexual Activity  Drug Use Yes   Types: Marijuana    Social History   Socioeconomic History   Marital status: Legally Separated    Spouse name: Not on file   Number of children: Not on file   Years of education: Not on file   Highest education level: Not on file  Occupational History   Not on file  Tobacco Use   Smoking status: Every Day    Packs/day: 1.00    Types: Cigarettes   Smokeless tobacco: Never  Vaping Use   Vaping Use: Never used  Substance and Sexual Activity   Alcohol use: Yes    Alcohol/week: 3.0 standard drinks    Types: 3 Shots of liquor per  week   Drug use: Yes    Types: Marijuana   Sexual activity: Not Currently  Other Topics Concern   Not on file  Social History Narrative   Not on file   Social Determinants of Health   Financial Resource Strain: Not on file  Food Insecurity: Not on file  Transportation Needs: Not on file  Physical Activity: Not on file  Stress: Not on file  Social Connections: Not on file   Additional Social History:     Sleep: Poor  Appetite:  Fair  Current Medications: Current Facility-Administered Medications  Medication Dose Route Frequency Provider Last Rate Last Admin   acetaminophen (TYLENOL) tablet 650 mg  650 mg Oral Q6H PRN Clapacs, John T, MD   650 mg at 10/27/21 0827   alum & mag hydroxide-simeth (MAALOX/MYLANTA) 200-200-20 MG/5ML suspension 30 mL  30 mL Oral Q4H PRN Clapacs, John T, MD       amLODipine (NORVASC) tablet 10 mg  10 mg Oral Daily Clapacs, Jackquline Denmark, MD   10 mg at 10/29/21 0836   ARIPiprazole (ABILIFY) tablet 15 mg  15 mg Oral Daily Clapacs, Jackquline Denmark, MD   15 mg at 10/29/21 0836   lisinopril (ZESTRIL) tablet 20 mg  20 mg Oral Daily Clapacs, Jackquline Denmark, MD  20 mg at 10/29/21 0836   magnesium hydroxide (MILK OF MAGNESIA) suspension 30 mL  30 mL Oral Daily PRN Clapacs, Jackquline Denmark, MD       metoprolol tartrate (LOPRESSOR) tablet 100 mg  100 mg Oral BID Clapacs, Jackquline Denmark, MD   100 mg at 10/29/21 0836   pantoprazole (PROTONIX) EC tablet 20 mg  20 mg Oral Daily Clapacs, Jackquline Denmark, MD   20 mg at 10/29/21 0836   traZODone (DESYREL) tablet 100 mg  100 mg Oral QHS PRN Clapacs, Jackquline Denmark, MD       traZODone (DESYREL) tablet 100 mg  100 mg Oral QHS Crystallee Werden, MD   100 mg at 10/28/21 2116    Lab Results:  Results for orders placed or performed during the hospital encounter of 10/26/21 (from the past 48 hour(s))  Hemoglobin A1c     Status: Abnormal   Collection Time: 10/28/21  5:56 AM  Result Value Ref Range   Hgb A1c MFr Bld 6.3 (H) 4.8 - 5.6 %    Comment: (NOTE) Pre diabetes:           5.7%-6.4%  Diabetes:              >6.4%  Glycemic control for   <7.0% adults with diabetes    Mean Plasma Glucose 134.11 mg/dL    Comment: Performed at [facility], [address] [place], [place] [address]  Lipid panel     Status: Abnormal   Collection Time: 10/28/21  5:56 AM  Result Value Ref Range   Cholesterol 197 0 - 200 mg/dL   Triglycerides 616 <073 mg/dL   HDL 35 (L) >71 mg/dL   Total CHOL/HDL Ratio 5.6 RATIO   VLDL 27 0 - 40 mg/dL   LDL Cholesterol 062 (H) 0 - 99 mg/dL    Comment:        Total Cholesterol/HDL:CHD Risk Coronary Heart Disease Risk Table                     Men   Women  1/2 Average Risk   3.4   3.3  Average Risk       5.0   4.4  2 X Average Risk   9.6   7.1  3 X Average Risk  23.4   11.0        Use the calculated Patient Ratio above and the CHD Risk Table to determine the patient's CHD Risk.        ATP III CLASSIFICATION (LDL):  <100     mg/dL   Optimal  694-854  mg/dL   Near or Above                    Optimal  130-159  mg/dL   Borderline  627-035  mg/dL   High  >009     mg/dL   Very High Performed at [facility], [address] [place], [place] [address]   TSH     Status: None   Collection Time: 10/28/21  5:56 AM  Result Value Ref Range   TSH 3.640 0.350 - 4.500 uIU/mL    Comment: Performed by a 3rd Generation assay with a functional sensitivity of <=0.01 uIU/mL. Performed at [facility], [address] [place], [place] [address]     Blood Alcohol level:  Lab Results  Component Value Date   [facility] <10 10/25/2021   ETH <10 09/14/2021    Metabolic Disorder  Labs: Lab Results  Component Value Date   HGBA1C 6.3 (H) 10/28/2021   MPG 134.11 10/28/2021   MPG 119.76 07/25/2021   No results found for: PROLACTIN Lab Results  Component Value Date   CHOL 197 10/28/2021   TRIG 135 10/28/2021   HDL 35 (L) 10/28/2021   CHOLHDL 5.6 10/28/2021   VLDL 27 10/28/2021   LDLCALC 135 (H) 10/28/2021   LDLCALC 101 (H)  07/22/2021    Physical Findings: AIMS:  , ,  ,  ,    CIWA:    COWS:     Musculoskeletal: Strength & Muscle Tone: within normal limits Gait & Station: normal Patient leans: N/A  Psychiatric Specialty Exam:  Presentation  General Appearance: Appropriate for Environment  Eye Contact:Fair  Speech:Clear and Coherent  Speech Volume:Normal  Handedness:Right   Mood and Affect  Mood:Depressed  Affect:Congruent   Thought Process  Thought Processes:Coherent  Descriptions of Associations:Circumstantial  Orientation:Full (Time, Place and Person)  Thought Content:Logical  History of Schizophrenia/Schizoaffective disorder:Yes  Duration of Psychotic Symptoms:Greater than six months  Hallucinations:No data recorded Ideas of Reference:None  Suicidal Thoughts:No data recorded Homicidal Thoughts:No data recorded  Sensorium  Memory:Immediate Fair; Recent Fair; Remote Fair  Judgment:Fair  Insight:Fair   Executive Functions  Concentration:Fair  Attention Span:Fair  Recall:Fair  Fund of Knowledge:Fair  Language:Fair   Psychomotor Activity  Psychomotor Activity:No data recorded  Assets  Assets:Communication Skills; Desire for Improvement; Financial Resources/Insurance; Housing; Physical Health   Sleep  Sleep:No data recorded   Physical Exam: Physical Exam Psychiatric:        Attention and Perception: Attention normal.        Mood and Affect: Mood is anxious and depressed. Affect is blunt.        Speech: Speech normal.        Behavior: Behavior normal.        Thought Content: Thought content normal.        Cognition and Memory: Cognition normal.        Judgment: Judgment normal.   ROS Blood pressure (!) 117/95, pulse 87, temperature 97.8 F (36.6 C), temperature source Oral, resp. rate 18, height 5\' 10"  (1.778 m), weight 89.4 kg, SpO2 100 %. Body mass index is 28.27 kg/m.   Treatment Plan Summary: Daily contact with patient to assess and  evaluate symptoms and progress in treatment and Medication management  Schizoaffective disorder -- continue Abilify 15mg  daily.  -- continue trazodone 100mg  qhs.    Delma Drone, MD 10/29/2021, 2:23 PM

## 2021-10-29 NOTE — Plan of Care (Signed)
D- Patient alert and oriented. Patient presented in a pleasant mood on assessment reporting that he slept "good" last night and had no complaints to voice to this Clinical research associate. Patient denied SI, HI, AVH, and pain at this time. Per his self-inventory and follow-up from this writer, patient also denied any signs/symptoms of depression and anxiety. Patient's goal for today is "staying in today".  A- Scheduled medications administered to patient, per MD orders. Support and encouragement provided.  Routine safety checks conducted every 15 minutes.  Patient informed to notify staff with problems or concerns.  R- No adverse drug reactions noted. Patient contracts for safety at this time. Patient compliant with medications and treatment plan. Patient receptive, calm, and cooperative. Patient interacts well with others on the unit. Patient remains safe at this time.  Problem: Education: Goal: Knowledge of [facility] Education information/materials will improve Outcome: Progressing Goal: Emotional status will improve Outcome: Progressing Goal: Mental status will improve Outcome: Progressing Goal: Verbalization of understanding the information provided will improve Outcome: Progressing   Problem: Safety: Goal: Periods of time without injury will increase Outcome: Progressing   Problem: Coping: Goal: Coping ability will improve Outcome: Progressing Goal: Will verbalize feelings Outcome: Progressing   Problem: Safety: Goal: Ability to disclose and discuss suicidal ideas will improve Outcome: Progressing Goal: Ability to identify and utilize support systems that promote safety will improve Outcome: Progressing   Problem: Self-Concept: Goal: Level of anxiety will decrease Outcome: Progressing   Problem: Self-Concept: Goal: Ability to identify factors that promote anxiety will improve Outcome: Progressing Goal: Level of anxiety will decrease Outcome: Progressing Goal: Ability to modify  response to factors that promote anxiety will improve Outcome: Progressing   Problem: Coping: Goal: Coping ability will improve Outcome: Progressing   Problem: Health Behavior/Discharge Planning: Goal: Identification of resources available to assist in meeting health care needs will improve Outcome: Progressing   Problem: Self-Concept: Goal: Will verbalize positive feelings about self Outcome: Progressing   Problem: Education: Goal: Will be free of psychotic symptoms Outcome: Progressing Goal: Knowledge of the prescribed therapeutic regimen will improve Outcome: Progressing

## 2021-10-29 NOTE — Plan of Care (Signed)
See nursing note Problem: Education: Goal: Knowledge of [facility] Education information/materials will improve Outcome: Progressing Goal: Emotional status will improve Outcome: Progressing Goal: Mental status will improve Outcome: Progressing Goal: Verbalization of understanding the information provided will improve Outcome: Progressing   Problem: Safety: Goal: Periods of time without injury will increase Outcome: Progressing   Problem: Coping: Goal: Coping ability will improve Outcome: Progressing Goal: Will verbalize feelings Outcome: Progressing   Problem: Safety: Goal: Ability to disclose and discuss suicidal ideas will improve Outcome: Progressing Goal: Ability to identify and utilize support systems that promote safety will improve Outcome: Progressing   Problem: Self-Concept: Goal: Level of anxiety will decrease Outcome: Progressing   Problem: Self-Concept: Goal: Ability to identify factors that promote anxiety will improve Outcome: Progressing Goal: Level of anxiety will decrease Outcome: Progressing Goal: Ability to modify response to factors that promote anxiety will improve Outcome: Progressing   Problem: Coping: Goal: Coping ability will improve Outcome: Progressing   Problem: Health Behavior/Discharge Planning: Goal: Identification of resources available to assist in meeting health care needs will improve Outcome: Progressing   Problem: Self-Concept: Goal: Will verbalize positive feelings about self Outcome: Progressing   Problem: Education: Goal: Will be free of psychotic symptoms Outcome: Progressing Goal: Knowledge of the prescribed therapeutic regimen will improve Outcome: Progressing

## 2021-10-29 NOTE — Group Note (Signed)
LCSW Group Therapy Note  Group Date: 10/28/2021 Start Time: 1430 End Time: 1530   Type of Therapy and Topic:  Group Therapy - Healthy vs Unhealthy Coping Skills  Participation Level:  Active   Description of Group The focus of this group was to determine what unhealthy coping techniques typically are used by group members and what healthy coping techniques would be helpful in coping with various problems. Patients were guided in becoming aware of the differences between healthy and unhealthy coping techniques. Patients were asked to identify 2-3 healthy coping skills they would like to learn to use more effectively.  Therapeutic Goals Patients learned that coping is what human beings do all day long to deal with various situations in their lives Patients defined and discussed healthy vs unhealthy coping techniques Patients identified their preferred coping techniques and identified whether these were healthy or unhealthy Patients determined 2-3 healthy coping skills they would like to become more familiar with and use more often. Patients provided support and ideas to each other   Summary of Patient Progress: Patient was an active participant throughout the session. Patient identified using drugs and alcohol as well as getting into unhealthy relationships as coping skills that have made his life unmanageable. Patient shared that prior to being hospitalized, he was feeling suicidal and expressed how he continues to be unable to identify why he felt depressed and had "crying spells." Patient explored how the effects of his recent substance use has negatively impacted his wellbeing and expressed a desire to go to a long term substance use treatment program. Patient reflected on what actions he was taking to maintain recovery in the past when patient sustained from using drugs and alcohol for two years. Patient appears to be in the action stage of change.    Therapeutic Modalities Cognitive  Behavioral Therapy Motivational Interviewing  Norberto Sorenson, Theresia Majors 10/29/2021  8:21 AM

## 2021-10-30 DIAGNOSIS — F251 Schizoaffective disorder, depressive type: Secondary | ICD-10-CM | POA: Diagnosis not present

## 2021-10-30 MED ORDER — TRAZODONE HCL 100 MG PO TABS
200.0000 mg | ORAL_TABLET | Freq: Every day | ORAL | Status: DC
  Administered 2021-10-30: 200 mg via ORAL
  Filled 2021-10-30: qty 2

## 2021-10-30 NOTE — Group Note (Signed)
[facility] LCSW Group Therapy Note    Group Date: 10/30/2021 Start Time: 1300 End Time: 1400  Type of Therapy and Topic:  Group Therapy:  Overcoming Obstacles  Participation Level:  [facility] PARTICIPATION LEVEL: Active  Mood:  Description of Group:   In this group patients will be encouraged to explore what they see as obstacles to their own wellness and recovery. They will be guided to discuss their thoughts, feelings, and behaviors related to these obstacles. The group will process together ways to cope with barriers, with attention given to specific choices patients can make. Each patient will be challenged to identify changes they are motivated to make in order to overcome their obstacles. This group will be process-oriented, with patients participating in exploration of their own experiences as well as giving and receiving support and challenge from other group members.  Therapeutic Goals: 1. Patient will identify personal and current obstacles as they relate to admission. 2. Patient will identify barriers that currently interfere with their wellness or overcoming obstacles.  3. Patient will identify feelings, thought process and behaviors related to these barriers. 4. Patient will identify two changes they are willing to make to overcome these obstacles:    Summary of Patient Progress Patient was present in group.  Patient wasd initially the only person participating in discussion.  Patient shared that his obstacle recently has been "hearing voices".  Patient stated that the only way to address this was to "come to the hospital".  CSW encouraged patient to come to the hospital when needed, however, pointed out that auditory and visual hallucinations can be addressed at home with therapy, psychiatry and medication management.  Patient stated "I don't know why but I am taking this very personal".  Patient no longer participated on group following this  statement.   Therapeutic Modalities:   Cognitive Behavioral Therapy Solution Focused Therapy Motivational Interviewing Relapse Prevention Therapy   Rozann Lesches, LCSW

## 2021-10-30 NOTE — Plan of Care (Signed)
  Problem: Education: Goal: Knowledge of [facility] Education information/materials will improve Outcome: Progressing Goal: Emotional status will improve Outcome: Progressing Goal: Mental status will improve Outcome: Progressing Goal: Verbalization of understanding the information provided will improve Outcome: Progressing   Problem: Safety: Goal: Periods of time without injury will increase Outcome: Progressing   Problem: Coping: Goal: Coping ability will improve Outcome: Progressing Goal: Will verbalize feelings Outcome: Progressing   Problem: Safety: Goal: Ability to disclose and discuss suicidal ideas will improve Outcome: Progressing Goal: Ability to identify and utilize support systems that promote safety will improve Outcome: Progressing   Problem: Self-Concept: Goal: Level of anxiety will decrease Outcome: Progressing   Problem: Self-Concept: Goal: Ability to identify factors that promote anxiety will improve Outcome: Progressing Goal: Level of anxiety will decrease Outcome: Progressing Goal: Ability to modify response to factors that promote anxiety will improve Outcome: Progressing   Problem: Coping: Goal: Coping ability will improve Outcome: Progressing   Problem: Health Behavior/Discharge Planning: Goal: Identification of resources available to assist in meeting health care needs will improve Outcome: Progressing   Problem: Self-Concept: Goal: Will verbalize positive feelings about self Outcome: Progressing   Problem: Education: Goal: Will be free of psychotic symptoms Outcome: Progressing Goal: Knowledge of the prescribed therapeutic regimen will improve Outcome: Progressing

## 2021-10-30 NOTE — Plan of Care (Signed)
Patient sates this morning that he is doing good and likes to go to the rehab program. This after noon he states that he just want to go home tomorrow not to rehab program. Patient appropriate with staff & peers. Now patient came to nurses station states that he will do the interview for the rehab program before he go home. Denies SI,HI and AVH. Appetite and energy level good. Support and encouragement given.

## 2021-10-30 NOTE — Progress Notes (Signed)
Pt visible on the unit, he interacts with peers and staff. He denies SI/HI and AVH. No reports of anxiety or depression. His goal is to get some substance abuse treatment and to be discharged soon.

## 2021-10-30 NOTE — Progress Notes (Signed)
[facility] MD Progress Note  10/30/2021 10:34 AM Cole Cannon  MRN:  [id number] Subjective: Follow-up for this 61 year old man with a history of schizoaffective disorder and substance abuse.  Patient tells me he is feeling much better today.  Mood is improved.  Denies all suicidal thoughts.  Denies hallucinations.  Says he is feeling more motivated and optimistic about staying sober outside the hospital.  He has made some effort to call some rehab programs so far without clear success.  No complaints of any physical problems or side effects from medicine other than having some difficulty sleeping last night Principal Problem: Schizoaffective disorder (HCC) Diagnosis: Principal Problem:   Schizoaffective disorder (HCC) Active Problems:   Congestive heart failure (HCC)   CAD (coronary artery disease)   Dyslipidemia   HTN (hypertension)  Total Time spent with patient: 30 minutes  Past Psychiatric History: Past history of schizoaffective disorder and substance abuse  Past Medical History:  Past Medical History:  Diagnosis Date   Asthma    Depression    Heart attack (HCC)    Hypertension    Seizures (HCC)    History reviewed. No pertinent surgical history. Family History:  Family History  Problem Relation Age of Onset   Cancer Sister    Cancer Brother    Family Psychiatric  History: See previous Social History:  Social History   Substance and Sexual Activity  Alcohol Use Yes   Alcohol/week: 3.0 standard drinks   Types: 3 Shots of liquor per week     Social History   Substance and Sexual Activity  Drug Use Yes   Types: Marijuana    Social History   Socioeconomic History   Marital status: Legally Separated    Spouse name: Not on file   Number of children: Not on file   Years of education: Not on file   Highest education level: Not on file  Occupational History   Not on file  Tobacco Use   Smoking status: Every Day    Packs/day: 1.00    Types: Cigarettes   Smokeless  tobacco: Never  Vaping Use   Vaping Use: Never used  Substance and Sexual Activity   Alcohol use: Yes    Alcohol/week: 3.0 standard drinks    Types: 3 Shots of liquor per week   Drug use: Yes    Types: Marijuana   Sexual activity: Not Currently  Other Topics Concern   Not on file  Social History Narrative   Not on file   Social Determinants of Health   Financial Resource Strain: Not on file  Food Insecurity: Not on file  Transportation Needs: Not on file  Physical Activity: Not on file  Stress: Not on file  Social Connections: Not on file   Additional Social History:                         Sleep: Fair  Appetite:  Fair  Current Medications: Current Facility-Administered Medications  Medication Dose Route Frequency Provider Last Rate Last Admin   acetaminophen (TYLENOL) tablet 650 mg  650 mg Oral Q6H PRN Ramar Nobrega T, MD   650 mg at 10/27/21 0827   alum & mag hydroxide-simeth (MAALOX/MYLANTA) 200-200-20 MG/5ML suspension 30 mL  30 mL Oral Q4H PRN Kelena Garrow T, MD       amLODipine (NORVASC) tablet 10 mg  10 mg Oral Daily Damascus Feldpausch, Jackquline Denmark, MD   10 mg at 10/30/21 6063   ARIPiprazole (ABILIFY) tablet 15  mg  15 mg Oral Daily Marcee Jacobs, Jackquline Denmark, MD   15 mg at 10/30/21 4540   lisinopril (ZESTRIL) tablet 20 mg  20 mg Oral Daily Saniyah Mondesir T, MD   20 mg at 10/30/21 9811   magnesium hydroxide (MILK OF MAGNESIA) suspension 30 mL  30 mL Oral Daily PRN Classie Weng, Jackquline Denmark, MD       metoprolol tartrate (LOPRESSOR) tablet 100 mg  100 mg Oral BID Hellen Shanley T, MD   100 mg at 10/30/21 9147   pantoprazole (PROTONIX) EC tablet 20 mg  20 mg Oral Daily Taijah Macrae, Jackquline Denmark, MD   20 mg at 10/30/21 8295   traZODone (DESYREL) tablet 100 mg  100 mg Oral QHS PRN Soffia Doshier, Jackquline Denmark, MD   100 mg at 10/30/21 0208   traZODone (DESYREL) tablet 200 mg  200 mg Oral QHS Dailin Sosnowski T, MD        Lab Results: No results found for this or any previous visit (from the past 48 hour(s)).  Blood  Alcohol level:  Lab Results  Component Value Date   ETH <10 10/25/2021   ETH <10 09/14/2021    Metabolic Disorder Labs: Lab Results  Component Value Date   HGBA1C 6.3 (H) 10/28/2021   MPG 134.11 10/28/2021   MPG 119.76 07/25/2021   No results found for: PROLACTIN Lab Results  Component Value Date   CHOL 197 10/28/2021   TRIG 135 10/28/2021   HDL 35 (L) 10/28/2021   CHOLHDL 5.6 10/28/2021   VLDL 27 10/28/2021   LDLCALC 135 (H) 10/28/2021   LDLCALC 101 (H) 07/22/2021    Physical Findings: AIMS:  , ,  ,  ,    CIWA:    COWS:     Musculoskeletal: Strength & Muscle Tone: within normal limits Gait & Station: normal Patient leans: N/A  Psychiatric Specialty Exam:  Presentation  General Appearance: Appropriate for Environment  Eye Contact:Fair  Speech:Clear and Coherent  Speech Volume:Normal  Handedness:Right   Mood and Affect  Mood:Depressed  Affect:Congruent   Thought Process  Thought Processes:Coherent  Descriptions of Associations:Circumstantial  Orientation:Full (Time, Place and Person)  Thought Content:Logical  History of Schizophrenia/Schizoaffective disorder:Yes  Duration of Psychotic Symptoms:Greater than six months  Hallucinations:No data recorded Ideas of Reference:None  Suicidal Thoughts:No data recorded Homicidal Thoughts:No data recorded  Sensorium  Memory:Immediate Fair; Recent Fair; Remote Fair  Judgment:Fair  Insight:Fair   Executive Functions  Concentration:Fair  Attention Span:Fair  Recall:Fair  Fund of Knowledge:Fair  Language:Fair   Psychomotor Activity  Psychomotor Activity:No data recorded  Assets  Assets:Communication Skills; Desire for Improvement; Financial Resources/Insurance; Housing; Physical Health   Sleep  Sleep:No data recorded   Physical Exam: Physical Exam Vitals and nursing note reviewed.  Constitutional:      Appearance: Normal appearance.  HENT:     Head: Normocephalic and  atraumatic.     Mouth/Throat:     Pharynx: Oropharynx is clear.  Eyes:     Pupils: Pupils are equal, round, and reactive to light.  Cardiovascular:     Rate and Rhythm: Normal rate and regular rhythm.  Pulmonary:     Effort: Pulmonary effort is normal.     Breath sounds: Normal breath sounds.  Abdominal:     General: Abdomen is flat.     Palpations: Abdomen is soft.  Musculoskeletal:        General: Normal range of motion.  Skin:    General: Skin is warm and dry.  Neurological:  General: No focal deficit present.     Mental Status: He is alert. Mental status is at baseline.  Psychiatric:        Mood and Affect: Mood normal.        Thought Content: Thought content normal.   Review of Systems  Constitutional: Negative.   HENT: Negative.    Eyes: Negative.   Respiratory: Negative.    Cardiovascular: Negative.   Gastrointestinal: Negative.   Musculoskeletal: Negative.   Skin: Negative.   Neurological: Negative.   Psychiatric/Behavioral: Negative.    Blood pressure 120/83, pulse 90, temperature 98.4 F (36.9 C), temperature source Oral, resp. rate 18, height 5\' 10"  (1.778 m), weight 89.4 kg, SpO2 100 %. Body mass index is 28.27 kg/m.   Treatment Plan Summary: Medication management and Plan no change to medication management.  Reviewed treatment plan.  Encourage him to consider continuing to follow-up with rehab programs but if he is still feeling well we will plan for discharge tomorrow.  Supportive therapy counseling and review of treatment plan are completed.  , MD 10/30/2021, 10:34 AM

## 2021-10-30 NOTE — BHH Counselor (Signed)
Patient was argumentative and abrasive in group.   Patient reports plans to not stay for [facility] interview on 10/31/21 at 1:00 PM.  He reports that psychiatrist has told him that he will be discharged on 10/31/21.  Assunta Curtis, MSW, LCSW 10/30/2021 2:22 PM

## 2021-10-31 DIAGNOSIS — F251 Schizoaffective disorder, depressive type: Secondary | ICD-10-CM | POA: Diagnosis not present

## 2021-10-31 MED ORDER — PANTOPRAZOLE SODIUM 20 MG PO TBEC: 20.0000 mg | DELAYED_RELEASE_TABLET | Freq: Every day | ORAL | 1 refills | Status: AC

## 2021-10-31 MED ORDER — LISINOPRIL 20 MG PO TABS: 20.0000 mg | ORAL_TABLET | Freq: Every day | ORAL | 1 refills | Status: DC

## 2021-10-31 MED ORDER — METOPROLOL TARTRATE 100 MG PO TABS: 100.0000 mg | ORAL_TABLET | Freq: Two times a day (BID) | ORAL | 1 refills | Status: DC

## 2021-10-31 MED ORDER — ARIPIPRAZOLE 15 MG PO TABS: 15.0000 mg | ORAL_TABLET | Freq: Every day | ORAL | 1 refills | Status: AC

## 2021-10-31 MED ORDER — TRAZODONE HCL 100 MG PO TABS: 200.0000 mg | ORAL_TABLET | Freq: Every day | ORAL | 1 refills | Status: AC

## 2021-10-31 MED ORDER — AMLODIPINE BESYLATE 10 MG PO TABS
10.0000 mg | ORAL_TABLET | Freq: Every day | ORAL | 1 refills | Status: DC
Start: 2021-11-01 — End: 2021-11-03

## 2021-10-31 NOTE — Progress Notes (Signed)
Patient was given an [place] phone, to call for his 1 pm phone interview with [facility]. Patient is sitting in the chair, outside of the nurses station completing his phone interview. Patient remains safe on the unit.

## 2021-10-31 NOTE — [facility] Suicide Risk Assessment (Signed)
[facility] Discharge Suicide Risk Assessment   Principal Problem: Schizoaffective disorder [facility]) Discharge Diagnoses: Principal Problem:   Schizoaffective disorder (HCC) Active Problems:   Congestive heart failure (HCC)   CAD (coronary artery disease)   Dyslipidemia   HTN (hypertension)   Total Time spent with patient: 30 minutes  Musculoskeletal: Strength & Muscle Tone: within normal limits Gait & Station: normal Patient leans: N/A  Psychiatric Specialty Exam  Presentation  General Appearance: Appropriate for Environment  Eye Contact:Fair  Speech:Clear and Coherent  Speech Volume:Normal  Handedness:Right   Mood and Affect  Mood:Depressed  Duration of Depression Symptoms: Greater than two weeks  Affect:Congruent   Thought Process  Thought Processes:Coherent  Descriptions of Associations:Circumstantial  Orientation:Full (Time, Place and Person)  Thought Content:Logical  History of Schizophrenia/Schizoaffective disorder:Yes  Duration of Psychotic Symptoms:Greater than six months  Hallucinations:No data recorded Ideas of Reference:None  Suicidal Thoughts:No data recorded Homicidal Thoughts:No data recorded  Sensorium  Memory:Immediate Fair; Recent Fair; Remote Fair  Judgment:Fair  Insight:Fair   Executive Functions  Concentration:Fair  Attention Span:Fair  Recall:Fair  Fund of Knowledge:Fair  Language:Fair   Psychomotor Activity  Psychomotor Activity:No data recorded  Assets  Assets:Communication Skills; Desire for Improvement; Financial Resources/Insurance; Housing; Physical Health   Sleep  Sleep:No data recorded  Physical Exam: Physical Exam Vitals and nursing note reviewed.  Constitutional:      Appearance: Normal appearance.  HENT:     Head: Normocephalic and atraumatic.     Mouth/Throat:     Pharynx: Oropharynx is clear.  Eyes:     Pupils: Pupils are equal, round, and reactive to light.  Cardiovascular:     Rate and  Rhythm: Normal rate and regular rhythm.  Pulmonary:     Effort: Pulmonary effort is normal.     Breath sounds: Normal breath sounds.  Abdominal:     General: Abdomen is flat.     Palpations: Abdomen is soft.  Musculoskeletal:        General: Normal range of motion.  Skin:    General: Skin is warm and dry.  Neurological:     General: No focal deficit present.     Mental Status: He is alert. Mental status is at baseline.  Psychiatric:        Attention and Perception: Attention normal.        Mood and Affect: Mood normal.        Speech: Speech normal.        Behavior: Behavior normal.        Thought Content: Thought content normal.        Cognition and Memory: Cognition normal.        Judgment: Judgment normal.   Review of Systems  Constitutional: Negative.   HENT: Negative.    Eyes: Negative.   Respiratory: Negative.    Cardiovascular: Negative.   Gastrointestinal: Negative.   Musculoskeletal: Negative.   Skin: Negative.   Neurological: Negative.   Psychiatric/Behavioral: Negative.  Negative for depression.   Blood pressure (!) 130/101, pulse 98, temperature (!) 97.5 F (36.4 C), temperature source Oral, resp. rate 18, height 5\' 10"  (1.778 m), weight 89.4 kg, SpO2 100 %. Body mass index is 28.27 kg/m.  Mental Status Per Nursing Assessment::   On Admission:  Self-harm thoughts  Demographic Factors:  Male  Loss Factors: Financial problems/change in socioeconomic status  Historical Factors: NA  Risk Reduction Factors:   Positive social support and Positive therapeutic relationship  Continued Clinical Symptoms:  Depression:   Comorbid alcohol abuse/dependence Alcohol/Substance  Abuse/Dependencies  Cognitive Features That Contribute To Risk:  None    Suicide Risk:  Minimal: No identifiable suicidal ideation.  Patients presenting with no risk factors but with morbid ruminations; may be classified as minimal risk based on the severity of the depressive  symptoms    Plan Of Care/Follow-up recommendations:  Patient currently denies all suicidal ideation and is showing an upbeat affect.  Agrees to outpatient follow-up treatment for both substance abuse and mental health care.  Patient can be discharged today with follow-up arrangements recommended and prescriptions provided.  Mordecai Rasmussen, MD 10/31/2021, 11:23 AM

## 2021-10-31 NOTE — BHH Counselor (Signed)
CSW has provided pt information on Quitline.  Pt declined for this CSW to make formal referral.   CSW provided patient with information on [facility] and [employer]. Patient reports that he will follow up on his own.  CSW also provided patient with list of additional SUD resources.   Penni Homans, MSW, LCSW 10/31/2021 3:18 PM

## 2021-10-31 NOTE — Progress Notes (Signed)
  [facility] Adult Case Management Discharge Plan :  Will you be returning to the same living situation after discharge:  Yes,  pt reports that he is returning to his home. At discharge, do you have transportation home?: Yes,  pt reports that his family will provide transportation.  Do you have the ability to pay for your medications: Yes,  [employer] Medicare, [place] Medicaid.  Release of information consent forms completed and in the chart;  Patient's signature needed at discharge.  Patient to Follow up at:  [facility].   Why: Please call to follow up on residential bed. Contact information: [place] [address] [phone]         [facility] Follow up.   Why: Please call to follow up.  This is a residential SU facility. Contact information: [address] [place], [place] [address]                  Intake Line: [phone],  Mens Program: [facility] Monarch:  Executive Director [phone],       [facility]askell Flirt:        [facility] Follow up.   Why: Please call to schedule an intake appointment for outpatient. Contact information: [place] [address] [phone]                 Next level of care provider has access to [place] and Suicide Prevention discussed: Yes,  SPE completed with the patient and patient's daughter.      Has patient been referred to the Quitline?: Patient refused referral  CSW has provided the patient with information on Quitline, however, patient declined this CSW to make referral for Quitline.   Patient has been referred for addiction treatment: Yes  Pt was declined at [facility].  Pt referred to [facility].  Pt requested to be discharged and stated he will follow up on his own at home. CSW has provided patient with information on the programs available.    Rozann Lesches, LCSW 10/31/2021, 3:10 PM

## 2021-10-31 NOTE — Progress Notes (Signed)
Patient ID: Cole Cannon, male   DOB: [birth year], 61 y.o.   MRN: [id number]  Discharge Note:  Patient denies SI/HI/AVH at this time. Discharge instructions, AVS, prescriptions, and transition record gone over with patient. Patient agrees to comply with medication management, follow-up, and outpatient therapy. Patient belongings returned to patient. Patient questions and concerns addressed and answered. Patient ambulatory off unit. Patient discharged to home with his daughter.

## 2021-10-31 NOTE — Discharge Summary (Signed)
Physician Discharge Summary Note  Patient:  Cole Cannon is an 61 y.o., male MRN:  [id number] DOB:  [birth year] Patient phone:  [phone] (home)  Patient address:   [address] [place] [address],  Total Time spent with patient: 30 minutes  Date of Admission:  10/26/2021 Date of Discharge: 10/31/2021  Reason for Admission: Patient was admitted after presenting with substance abuse and suicidal ideation and hallucinations and paranoia in the context of heavy substance abuse  Principal Problem: Schizoaffective disorder [facility]) Discharge Diagnoses: Principal Problem:   Schizoaffective disorder (HCC) Active Problems:   Congestive heart failure (HCC)   CAD (coronary artery disease)   Dyslipidemia   HTN (hypertension)   Past Psychiatric History: Past history of chronic and recurrent depression sometimes with psychotic features and chronic substance abuse issues  Past Medical History:  Past Medical History:  Diagnosis Date   Asthma    Depression    Heart attack (HCC)    Hypertension    Seizures (HCC)    History reviewed. No pertinent surgical history. Family History:  Family History  Problem Relation Age of Onset   Cancer Sister    Cancer Brother    Family Psychiatric  History: See previous Social History:  Social History   Substance and Sexual Activity  Alcohol Use Yes   Alcohol/week: 3.0 standard drinks   Types: 3 Shots of liquor per week     Social History   Substance and Sexual Activity  Drug Use Yes   Types: Marijuana    Social History   Socioeconomic History   Marital status: Legally Separated    Spouse name: Not on file   Number of children: Not on file   Years of education: Not on file   Highest education level: Not on file  Occupational History   Not on file  Tobacco Use   Smoking status: Every Day    Packs/day: 1.00    Types: Cigarettes   Smokeless tobacco: Never  Vaping Use   Vaping Use: Never used  Substance and Sexual  Activity   Alcohol use: Yes    Alcohol/week: 3.0 standard drinks    Types: 3 Shots of liquor per week   Drug use: Yes    Types: Marijuana   Sexual activity: Not Currently  Other Topics Concern   Not on file  Social History Narrative   Not on file   Social Determinants of Health   Financial Resource Strain: Not on file  Food Insecurity: Not on file  Transportation Needs: Not on file  Physical Activity: Not on file  Stress: Not on file  Social Connections: Not on file    Hospital Course: Patient admitted to psychiatric unit.  15-minute checks continued.  Patient engaged appropriately in individual and group therapy.  He did not display any dangerous behaviors while in the hospital.  Patient was cooperative with treatment and was started back on Abilify.  Tolerated medicine well and also tolerated well increasing trazodone to 200 mg at night.  Patient reports mood is much better.  No longer has any suicidal thoughts.  He has participated in some efforts to start looking into substance abuse rehab treatment.  He agrees to follow-up treatment after discharge.  Physical Findings: AIMS:  , ,  ,  ,    CIWA:    COWS:     Musculoskeletal: Strength & Muscle Tone: within normal limits Gait & Station: normal Patient leans: N/A   Psychiatric Specialty Exam:  Presentation  General Appearance:  Appropriate for Environment  Eye Contact:Fair  Speech:Clear and Coherent  Speech Volume:Normal  Handedness:Right   Mood and Affect  Mood:Depressed  Affect:Congruent   Thought Process  Thought Processes:Coherent  Descriptions of Associations:Circumstantial  Orientation:Full (Time, Place and Person)  Thought Content:Logical  History of Schizophrenia/Schizoaffective disorder:Yes  Duration of Psychotic Symptoms:Greater than six months  Hallucinations:No data recorded Ideas of Reference:None  Suicidal Thoughts:No data recorded Homicidal Thoughts:No data recorded  Sensorium   Memory:Immediate Fair; Recent Fair; Remote Fair  Judgment:Fair  Insight:Fair   Executive Functions  Concentration:Fair  Attention Span:Fair  Recall:Fair  Fund of Knowledge:Fair  Language:Fair   Psychomotor Activity  Psychomotor Activity:No data recorded  Assets  Assets:Communication Skills; Desire for Improvement; Financial Resources/Insurance; Housing; Physical Health   Sleep  Sleep:No data recorded   Physical Exam: Physical Exam Vitals and nursing note reviewed.  Constitutional:      Appearance: Normal appearance.  HENT:     Head: Normocephalic and atraumatic.     Mouth/Throat:     Pharynx: Oropharynx is clear.  Eyes:     Pupils: Pupils are equal, round, and reactive to light.  Cardiovascular:     Rate and Rhythm: Normal rate and regular rhythm.  Pulmonary:     Effort: Pulmonary effort is normal.     Breath sounds: Normal breath sounds.  Abdominal:     General: Abdomen is flat.     Palpations: Abdomen is soft.  Musculoskeletal:        General: Normal range of motion.  Skin:    General: Skin is warm and dry.  Neurological:     General: No focal deficit present.     Mental Status: He is alert. Mental status is at baseline.  Psychiatric:        Attention and Perception: Attention normal.        Mood and Affect: Mood normal.        Speech: Speech normal.        Behavior: Behavior normal.        Thought Content: Thought content normal.        Cognition and Memory: Cognition normal.        Judgment: Judgment normal.   Review of Systems  Constitutional: Negative.   HENT: Negative.    Eyes: Negative.   Respiratory: Negative.    Cardiovascular: Negative.   Gastrointestinal: Negative.   Musculoskeletal: Negative.   Skin: Negative.   Neurological: Negative.   Psychiatric/Behavioral: Negative.    Blood pressure (!) 130/101, pulse 98, temperature (!) 97.5 F (36.4 C), temperature source Oral, resp. rate 18, height 5\' 10"  (1.778 m), weight 89.4 kg,  SpO2 100 %. Body mass index is 28.27 kg/m.   Social History   Tobacco Use  Smoking Status Every Day   Packs/day: 1.00   Types: Cigarettes  Smokeless Tobacco Never   Tobacco Cessation:  A prescription for an FDA-approved tobacco cessation medication was offered at discharge and the patient refused   Blood Alcohol level:  Lab Results  Component Value Date   [facility] <10 10/25/2021   ETH <10 09/14/2021    Metabolic Disorder Labs:  Lab Results  Component Value Date   HGBA1C 6.3 (H) 10/28/2021   MPG 134.11 10/28/2021   MPG 119.76 07/25/2021   No results found for: PROLACTIN Lab Results  Component Value Date   CHOL 197 10/28/2021   TRIG 135 10/28/2021   HDL 35 (L) 10/28/2021   CHOLHDL 5.6 10/28/2021   VLDL 27 10/28/2021   LDLCALC 135 (H)  10/28/2021   LDLCALC 101 (H) 07/22/2021    See Psychiatric Specialty Exam and Suicide Risk Assessment completed by Attending Physician prior to discharge.  Discharge destination:  Home  Is patient on multiple antipsychotic therapies at discharge:  No   Has Patient had three or more failed trials of antipsychotic monotherapy by history:  No  Recommended Plan for Multiple Antipsychotic Therapies: NA  Discharge Instructions     Diet - low sodium heart healthy   Complete by: As directed    Increase activity slowly   Complete by: As directed       Allergies as of 10/31/2021       Reactions   Penicillins Anaphylaxis, Hives   Tomato Rash        Medication List     STOP taking these medications    carbamazepine 200 MG tablet Commonly known as: TEGRETOL   citalopram 20 MG tablet Commonly known as: CELEXA       TAKE these medications      Indication  amLODipine 10 MG tablet Commonly known as: NORVASC Take 1 tablet (10 mg total) by mouth daily. Start taking on: Nov 01, 2021  Indication: High Blood Pressure Disorder   ARIPiprazole 15 MG tablet Commonly known as: ABILIFY Take 1 tablet (15 mg total) by mouth  daily. Start taking on: Nov 01, 2021  Indication: Major Depressive Disorder   lisinopril 20 MG tablet Commonly known as: ZESTRIL Take 1 tablet (20 mg total) by mouth daily. Start taking on: Nov 01, 2021  Indication: High Blood Pressure Disorder   metoprolol tartrate 100 MG tablet Commonly known as: LOPRESSOR Take 1 tablet (100 mg total) by mouth 2 (two) times daily.  Indication: High Blood Pressure Disorder   pantoprazole 20 MG tablet Commonly known as: PROTONIX Take 1 tablet (20 mg total) by mouth daily.  Indication: Gastroesophageal Reflux Disease   traZODone 100 MG tablet Commonly known as: DESYREL Take 2 tablets (200 mg total) by mouth at bedtime.  Indication: Trouble Sleeping         Follow-up recommendations: Recommend follow-up with local mental health agencies or with inpatient or residential rehab treatment.  Continue current medicine  Comments: Prescriptions provided  Signed: Mordecai Rasmussen, MD 10/31/2021, 11:27 AM

## 2021-10-31 NOTE — Progress Notes (Signed)
Recreation Therapy Notes  Date: 10/31/2021   Time: 10:40am      Location: Craft room  Behavioral response: Appropriate   Intervention Topic: Relaxation   Discussion/Intervention:  Group content today was focused on relaxation. The group defined relaxation and identified healthy ways to relax. Individuals expressed how much time they spend relaxing. Patients expressed how much their life would be if they did not make time for themselves to relax. The group stated ways they could improve their relaxation techniques in the future.  Individuals participated in the intervention "Time to Relax" where they had a chance to experience different relaxation techniques.  Clinical Observations/Feedback: Patient came to group and identified drink a cup of coffee and having a cigarette as how he relaxes. He explained that he participates in relaxation to reduce stress and have time for self. Participant expressed that relaxation is important to keep from being overwhelmed. Individual was social with peers and staff while participating in the intervention.    Warrick Llera LRT/CTRS         Rumaldo Difatta 10/31/2021 12:19 PM

## 2021-10-31 NOTE — Plan of Care (Signed)
D- Patient alert and oriented. Patient presented in a pleasant mood on assessment stating that he slept good last night and had no complaints to voice to this Clinical research associate. Patient denied SI, HI, AVH, and pain at the time of assessment. Patient also denied any signs/symptoms of depression and anxiety. Patient's goal for today is to "stay in the day".   A- Scheduled medications administered to patient, per MD orders. Support and encouragement provided.  Routine safety checks conducted every 15 minutes.  Patient informed to notify staff with problems or concerns.  R- No adverse drug reactions noted. Patient contracts for safety at this time. Patient compliant with medications and treatment plan. Patient receptive, calm, and cooperative. Patient interacts well with others on the unit. Patient remains safe at this time.  Problem: Education: Goal: Knowledge of [facility] Education information/materials will improve Outcome: Progressing Goal: Emotional status will improve Outcome: Progressing Goal: Mental status will improve Outcome: Progressing Goal: Verbalization of understanding the information provided will improve Outcome: Progressing   Problem: Safety: Goal: Periods of time without injury will increase Outcome: Progressing   Problem: Coping: Goal: Coping ability will improve Outcome: Progressing Goal: Will verbalize feelings Outcome: Progressing   Problem: Safety: Goal: Ability to disclose and discuss suicidal ideas will improve Outcome: Progressing Goal: Ability to identify and utilize support systems that promote safety will improve Outcome: Progressing   Problem: Self-Concept: Goal: Level of anxiety will decrease Outcome: Progressing   Problem: Self-Concept: Goal: Ability to identify factors that promote anxiety will improve Outcome: Progressing Goal: Level of anxiety will decrease Outcome: Progressing Goal: Ability to modify response to factors that promote anxiety  will improve Outcome: Progressing   Problem: Coping: Goal: Coping ability will improve Outcome: Progressing   Problem: Health Behavior/Discharge Planning: Goal: Identification of resources available to assist in meeting health care needs will improve Outcome: Progressing   Problem: Self-Concept: Goal: Will verbalize positive feelings about self Outcome: Progressing   Problem: Education: Goal: Will be free of psychotic symptoms Outcome: Progressing Goal: Knowledge of the prescribed therapeutic regimen will improve Outcome: Progressing

## 2021-10-31 NOTE — Group Note (Signed)
[facility] LCSW Group Therapy Note   Group Date: 10/31/2021 Start Time: 1300 End Time: 1400  Type of Therapy/Topic:  Group Therapy:  Feelings about Diagnosis  Participation Level:  Active  Description of Group:    This group will allow patients to explore their thoughts and feelings about diagnoses they have received. Patients will be guided to explore their level of understanding and acceptance of these diagnoses. Facilitator will encourage patients to process their thoughts and feelings about the reactions of others to their diagnosis, and will guide patients in identifying ways to discuss their diagnosis with significant others in their lives. This group will be process-oriented, with patients participating in exploration of their own experiences as well as giving and receiving support and challenge from other group members.   Therapeutic Goals: 1. Patient will demonstrate understanding of diagnosis as evidence by identifying two or more symptoms of the disorder:  2. Patient will be able to express two feelings regarding the diagnosis 3. Patient will demonstrate ability to communicate their needs through discussion and/or role plays  Summary of Patient Progress: Pt came into group late. However, he was actively engaged in the discussion, speaking about the negative aspects of having a mental health diagnosis like feeling abnormal, less than, or that he will never be normal. Pt spoke about taking medication which made him feel normal and getting to the point where he would stop doing that because he felt normal, which would begin the cycle all over again. He stated that his support system is good and that they often remind him to take his medications. Pt was involved in the discussion and open and receptive to feedback from peers and facilitator.  Therapeutic Modalities:   Cognitive Behavioral Therapy Brief Therapy Feelings Identification    Glenis Smoker, LCSW

## 2021-10-31 NOTE — [facility] Counselor (Signed)
CSW was informed by the pt's nurse that patient did not get into [facility] due to previous arnson charges.   CSW called [facility] and was informed that patient is DECLINED due to having Medicare.   CSW will continue to assess for available programs for the patient.  Penni Homans, MSW, LCSW 10/31/2021 1:49 PM

## 2021-10-31 NOTE — Progress Notes (Signed)
Patient's scheduled Protonix is out of stock here at [facility], per Protection from Pharmacy. Once medication is in from [facility], this Clinical research associate will administer medication.

## 2021-11-03 ENCOUNTER — Other Ambulatory Visit: Payer: Self-pay | Admitting: Nurse Practitioner

## 2021-11-29 ENCOUNTER — Other Ambulatory Visit: Payer: Self-pay | Admitting: Nurse Practitioner

## 2021-11-29 DIAGNOSIS — Z760 Encounter for issue of repeat prescription: Secondary | ICD-10-CM

## 2021-12-01 ENCOUNTER — Other Ambulatory Visit: Payer: Self-pay | Admitting: Nurse Practitioner

## 2021-12-01 DIAGNOSIS — Z760 Encounter for issue of repeat prescription: Secondary | ICD-10-CM

## 2021-12-09 ENCOUNTER — Other Ambulatory Visit: Payer: Self-pay | Admitting: Nurse Practitioner

## 2021-12-09 DIAGNOSIS — I10 Essential (primary) hypertension: Secondary | ICD-10-CM

## 2021-12-25 ENCOUNTER — Other Ambulatory Visit: Payer: Self-pay | Admitting: Nurse Practitioner

## 2021-12-25 DIAGNOSIS — Z760 Encounter for issue of repeat prescription: Secondary | ICD-10-CM

## 2022-01-22 ENCOUNTER — Encounter: Payer: Medicare Other | Attending: Psychology | Admitting: Psychology

## 2022-01-23 ENCOUNTER — Other Ambulatory Visit: Payer: Self-pay | Admitting: Nurse Practitioner

## 2022-01-23 DIAGNOSIS — Z760 Encounter for issue of repeat prescription: Secondary | ICD-10-CM

## 2022-01-31 ENCOUNTER — Other Ambulatory Visit: Payer: Self-pay | Admitting: Nurse Practitioner

## 2022-01-31 DIAGNOSIS — I10 Essential (primary) hypertension: Secondary | ICD-10-CM

## 2022-02-22 ENCOUNTER — Other Ambulatory Visit: Payer: Self-pay | Admitting: Nurse Practitioner

## 2022-02-22 DIAGNOSIS — Z760 Encounter for issue of repeat prescription: Secondary | ICD-10-CM

## 2022-02-22 DIAGNOSIS — I10 Essential (primary) hypertension: Secondary | ICD-10-CM

## 2022-03-26 ENCOUNTER — Other Ambulatory Visit: Payer: Self-pay | Admitting: Nurse Practitioner

## 2022-03-26 DIAGNOSIS — I10 Essential (primary) hypertension: Secondary | ICD-10-CM

## 2023-03-05 IMAGING — CT CT ABD-PELV W/ CM
2 of 5 series · 16 of 46 positions shown, 18 images · IV contrast (agent unspecified)
Comparison: None.

CLINICAL DATA: Epigastric abdominal pain, nausea

EXAM:
CT ABDOMEN AND PELVIS WITH CONTRAST
TECHNIQUE: Multidetector CT imaging of the abdomen and pelvis was performed
using the standard protocol following bolus administration of
intravenous contrast.

[Series 2: axial st · axial · 0.98mm/px · z∈[-351,+49]mm · 13 of 94 slices shown, 15 images]
[im 7/94  soft-tissue]
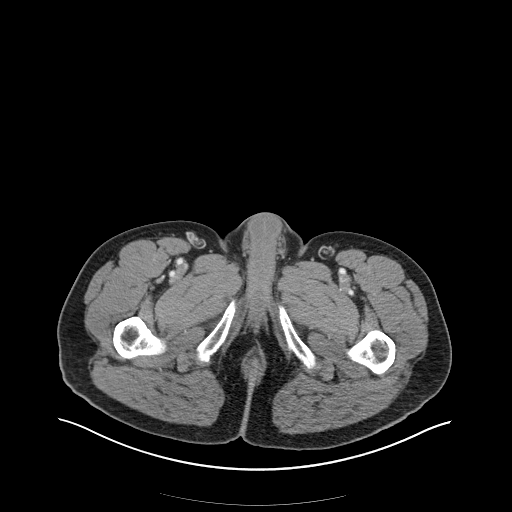
[im 7/94  bone]
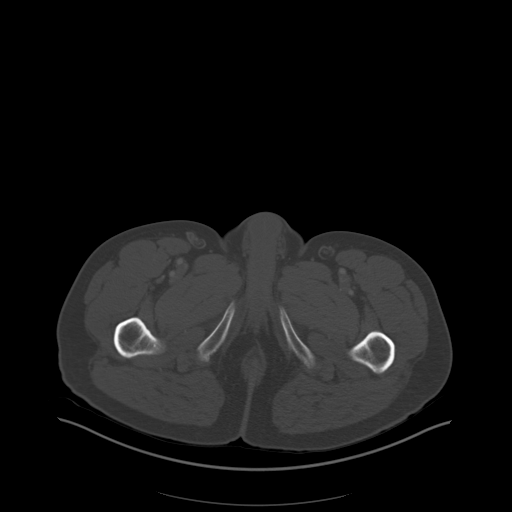
[im 13/94  soft-tissue]
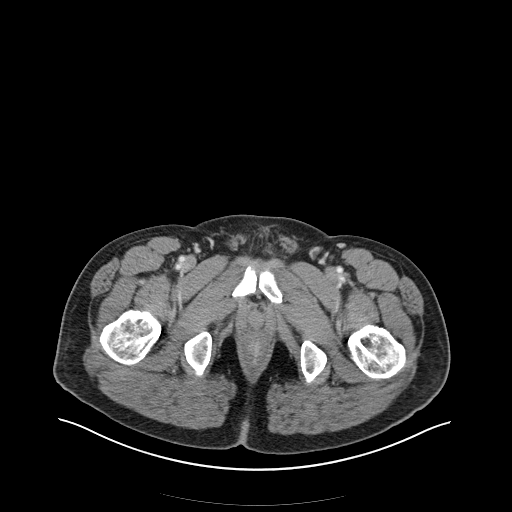
[im 19/94  soft-tissue]
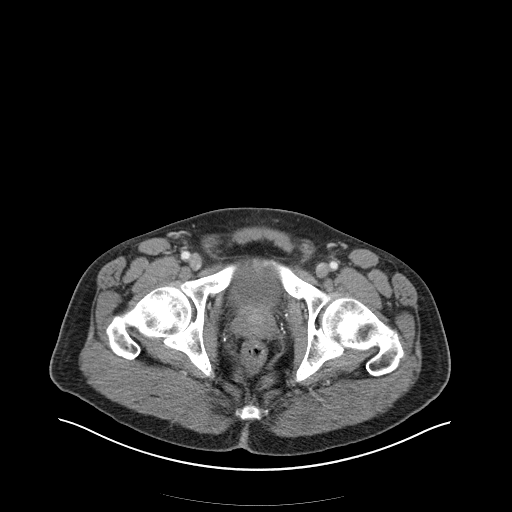
[im 25/94  soft-tissue]
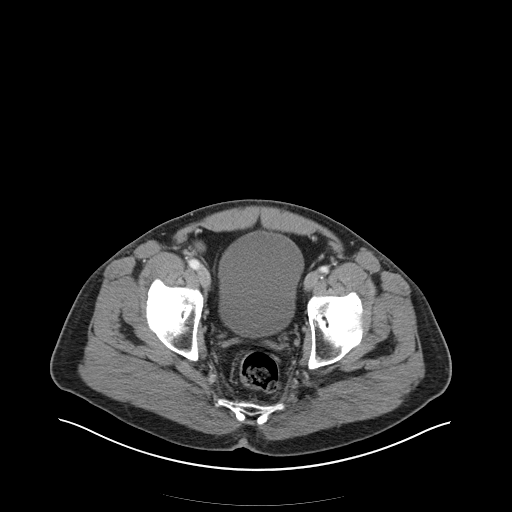
[im 32/94  soft-tissue]
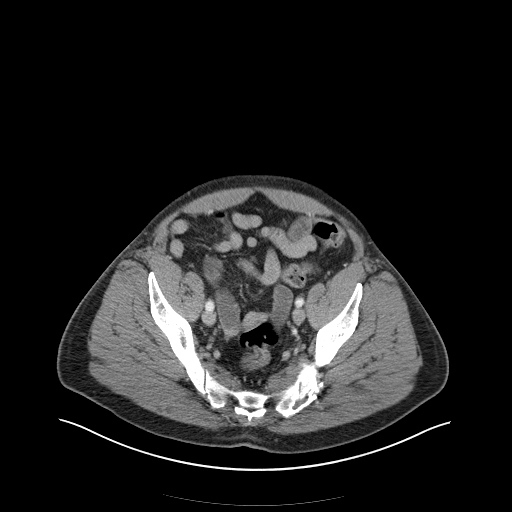
[im 38/94  soft-tissue]
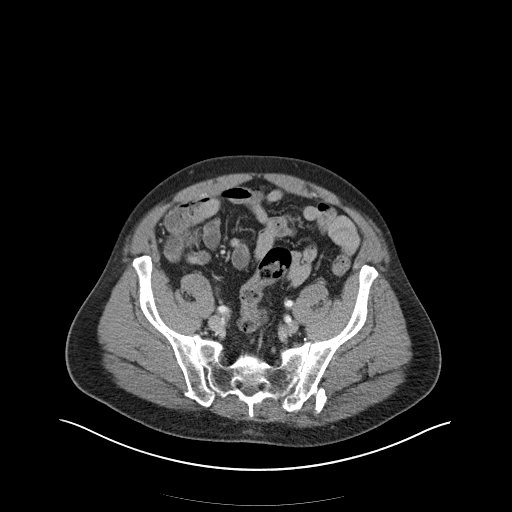
[im 50/94  soft-tissue]
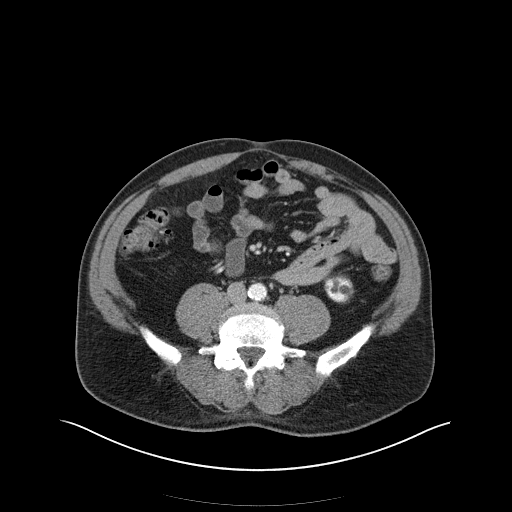
[im 56/94  soft-tissue]
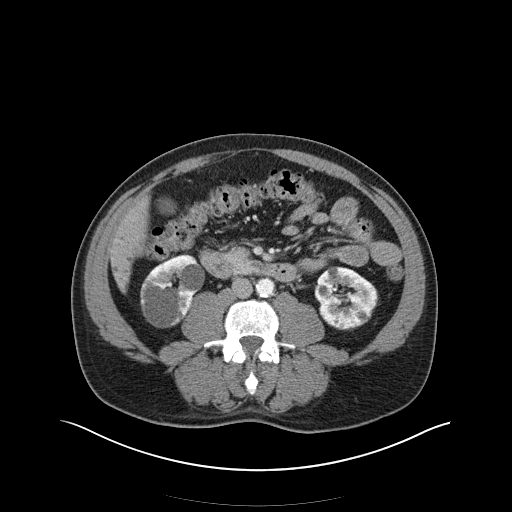
[im 63/94  soft-tissue]
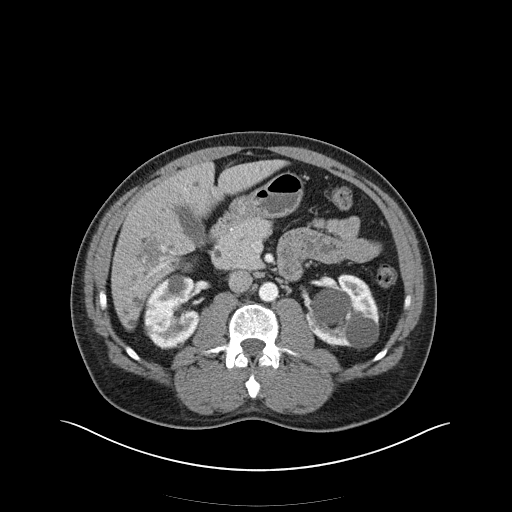
[im 63/94  bone]
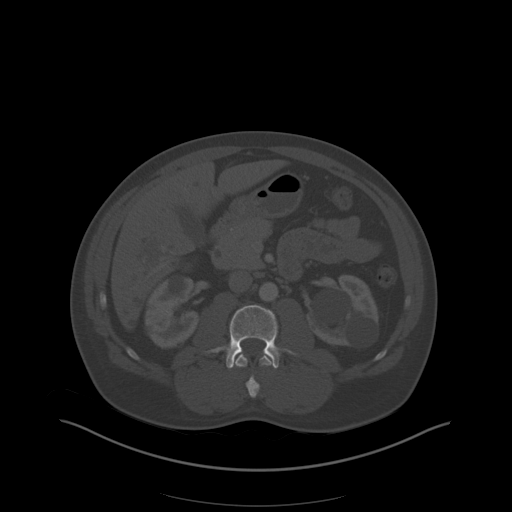
[im 69/94  soft-tissue]
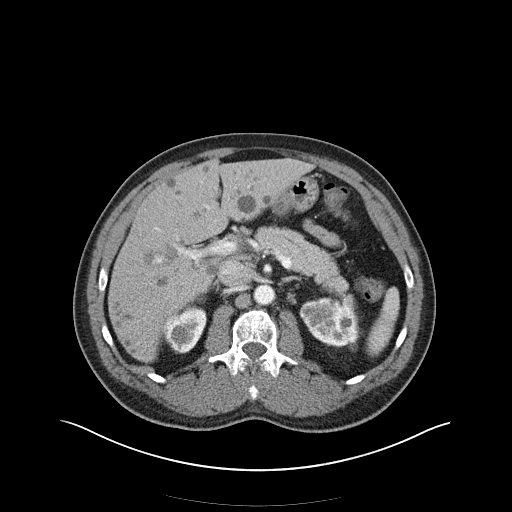
[im 75/94  soft-tissue]
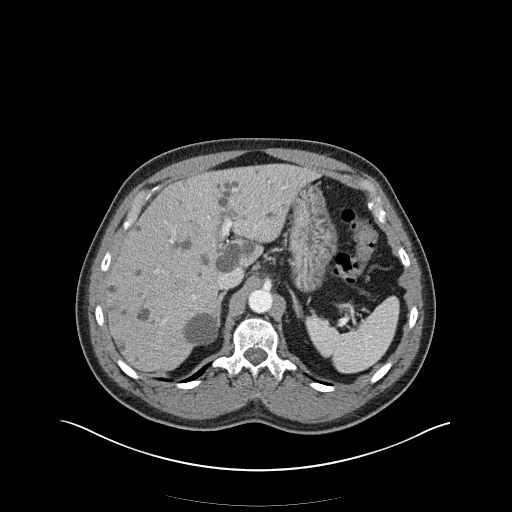
[im 81/94  soft-tissue]
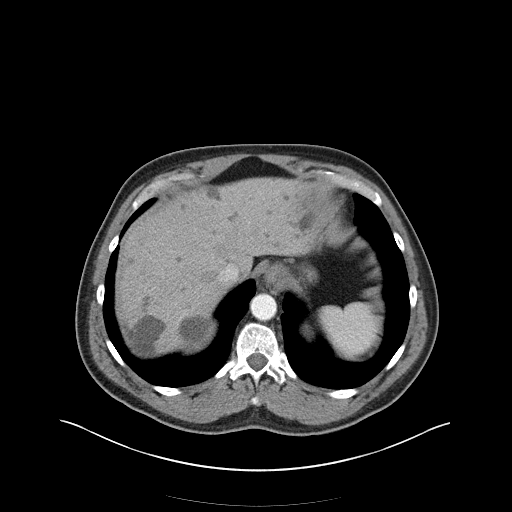
[im 87/94  soft-tissue]
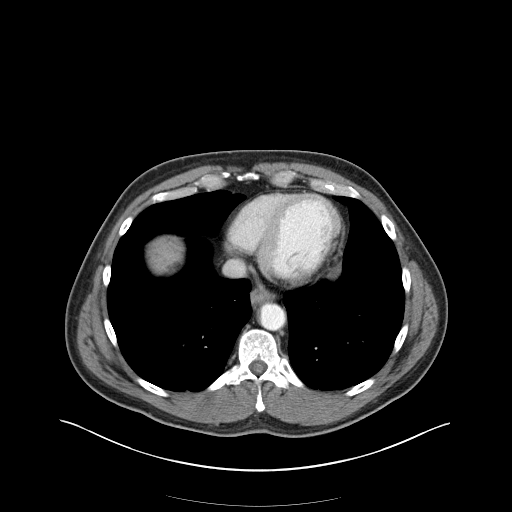

[Series 4: coronal st · coronal · 0.75mm/px · 3 of 165 slices shown]
[im 55/165  soft-tissue]
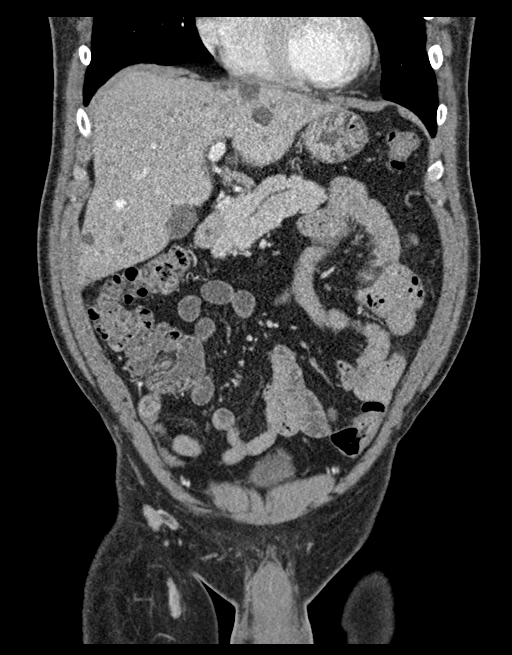
[im 73/165  soft-tissue]
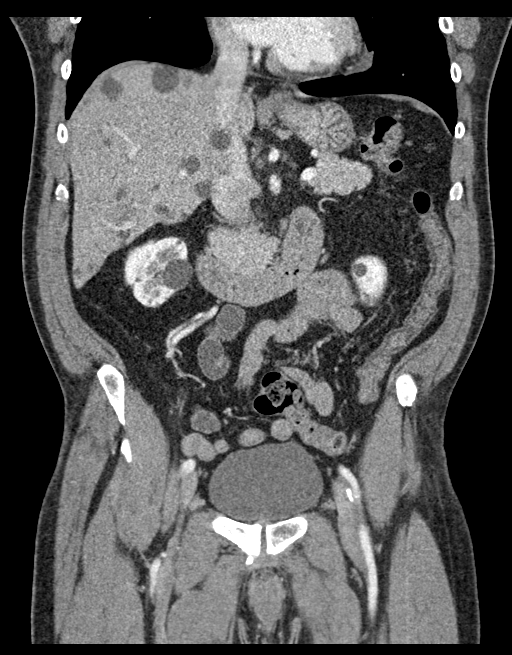
[im 92/165  soft-tissue]
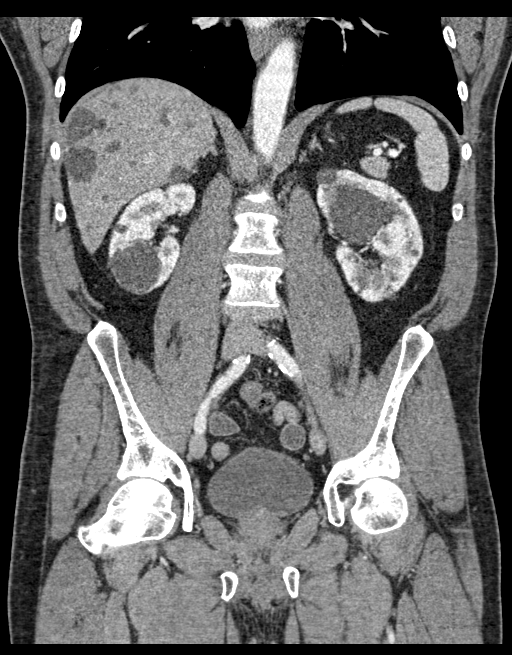

[16 of 46 positions shown; findings below may reference images not displayed]

RADIATION DOSE REDUCTION: This exam was performed according to the
departmental dose-optimization program which includes automated
exposure control, adjustment of the mA and/or kV according to
patient size and/or use of iterative reconstruction technique.

CONTRAST:  100mL OMNIPAQUE IOHEXOL 300 MG/ML  SOLN
FINDINGS: Lower chest: No acute abnormality. Trace fluid loculated within the
left major fissure.

Hepatobiliary: Innumerable cysts are seen throughout the liver.
Additionally, a 3.2 x 4.4 cm benign cavernous hemangioma seen within
the right hepatic lobe, axial image # [DATE]. No intra or extrahepatic
biliary ductal dilation. Gallbladder unremarkable.

Pancreas: Unremarkable

Spleen: Unremarkable

Adrenals/Urinary Tract: The adrenal glands are unremarkable.
Innumerable cysts are seen within the kidneys bilaterally in keeping
with changes of polycystic kidney disease. No follow-up imaging is
recommended for these lesions. No hydronephrosis. No enhancing
intrarenal masses. No intrarenal or ureteral calculi. The bladder is
unremarkable.

Stomach/Bowel: The stomach, small bowel, and large bowel are
unremarkable. Appendix absent. No free intraperitoneal gas or fluid.

Vascular/Lymphatic: Aortic atherosclerosis. No enlarged abdominal or
pelvic lymph nodes.

Reproductive: Prostate is unremarkable.

Other: No abdominal wall hernia.  Rectum unremarkable.

Musculoskeletal: Posttraumatic changes noted involving the left
ilium. Degenerative changes seen within the lumbar spine. No acute
bone abnormality.
IMPRESSION: No acute intra-abdominal pathology identified. No definite
radiographic explanation for the patient's reported symptoms.

Innumerable renal and hepatic cysts in keeping with changes of
autosomal dominant polycystic kidney disease.

4.4 cm benign right hepatic cavernous hemangioma.

Aortic Atherosclerosis ([id number]-L48.8).
